# Patient Record
Sex: Male | Born: 1975 | Race: Black or African American | Hispanic: No | Marital: Single | Smoking: Current every day smoker
Health system: Southern US, Community
[De-identification: ages and names within clinical notes are randomized; demographics above are authoritative.]

## PROBLEM LIST (undated history)

## (undated) DIAGNOSIS — I10 Essential (primary) hypertension: Secondary | ICD-10-CM

## (undated) DIAGNOSIS — G4730 Sleep apnea, unspecified: Secondary | ICD-10-CM

## (undated) DIAGNOSIS — R0902 Hypoxemia: Secondary | ICD-10-CM

## (undated) DIAGNOSIS — K219 Gastro-esophageal reflux disease without esophagitis: Secondary | ICD-10-CM

## (undated) DIAGNOSIS — K760 Fatty (change of) liver, not elsewhere classified: Secondary | ICD-10-CM

## (undated) DIAGNOSIS — F4310 Post-traumatic stress disorder, unspecified: Secondary | ICD-10-CM

## (undated) DIAGNOSIS — E669 Obesity, unspecified: Secondary | ICD-10-CM

## (undated) DIAGNOSIS — F209 Schizophrenia, unspecified: Secondary | ICD-10-CM

## (undated) DIAGNOSIS — T7840XA Allergy, unspecified, initial encounter: Secondary | ICD-10-CM

## (undated) DIAGNOSIS — F410 Panic disorder [episodic paroxysmal anxiety] without agoraphobia: Secondary | ICD-10-CM

## (undated) DIAGNOSIS — F319 Bipolar disorder, unspecified: Secondary | ICD-10-CM

## (undated) DIAGNOSIS — F32A Depression, unspecified: Secondary | ICD-10-CM

## (undated) HISTORY — DX: Gastro-esophageal reflux disease without esophagitis: K21.9

## (undated) HISTORY — DX: Fatty (change of) liver, not elsewhere classified: K76.0

## (undated) HISTORY — DX: Allergy, unspecified, initial encounter: T78.40XA

## (undated) HISTORY — DX: Depression, unspecified: F32.A

## (undated) HISTORY — DX: Sleep apnea, unspecified: G47.30

## (undated) HISTORY — DX: Hypoxemia: R09.02

---

## 1999-09-24 ENCOUNTER — Emergency Department (HOSPITAL_COMMUNITY): Admission: EM | Admit: 1999-09-24 | Discharge: 1999-09-24 | Payer: Self-pay | Admitting: Emergency Medicine

## 2000-03-27 ENCOUNTER — Encounter: Payer: Self-pay | Admitting: Emergency Medicine

## 2000-03-27 ENCOUNTER — Emergency Department (HOSPITAL_COMMUNITY): Admission: EM | Admit: 2000-03-27 | Discharge: 2000-03-27 | Payer: Self-pay | Admitting: Emergency Medicine

## 2004-01-26 ENCOUNTER — Emergency Department (HOSPITAL_COMMUNITY): Admission: EM | Admit: 2004-01-26 | Discharge: 2004-01-26 | Payer: Self-pay | Admitting: Emergency Medicine

## 2004-03-05 ENCOUNTER — Emergency Department (HOSPITAL_COMMUNITY): Admission: EM | Admit: 2004-03-05 | Discharge: 2004-03-05 | Payer: Self-pay | Admitting: Emergency Medicine

## 2004-09-08 ENCOUNTER — Emergency Department (HOSPITAL_COMMUNITY): Admission: EM | Admit: 2004-09-08 | Discharge: 2004-09-08 | Payer: Self-pay | Admitting: Emergency Medicine

## 2005-06-23 ENCOUNTER — Ambulatory Visit: Payer: Self-pay | Admitting: Infectious Diseases

## 2005-06-23 ENCOUNTER — Inpatient Hospital Stay (HOSPITAL_COMMUNITY): Admission: EM | Admit: 2005-06-23 | Discharge: 2005-06-26 | Payer: Self-pay

## 2005-11-01 ENCOUNTER — Emergency Department (HOSPITAL_COMMUNITY): Admission: EM | Admit: 2005-11-01 | Discharge: 2005-11-01 | Payer: Self-pay | Admitting: Family Medicine

## 2005-11-02 ENCOUNTER — Ambulatory Visit (HOSPITAL_COMMUNITY): Admission: RE | Admit: 2005-11-02 | Discharge: 2005-11-02 | Payer: Self-pay | Admitting: Family Medicine

## 2005-11-10 ENCOUNTER — Ambulatory Visit: Payer: Self-pay | Admitting: Gastroenterology

## 2007-11-07 ENCOUNTER — Emergency Department (HOSPITAL_COMMUNITY): Admission: EM | Admit: 2007-11-07 | Discharge: 2007-11-07 | Payer: Self-pay | Admitting: Family Medicine

## 2007-11-09 ENCOUNTER — Emergency Department (HOSPITAL_COMMUNITY): Admission: EM | Admit: 2007-11-09 | Discharge: 2007-11-09 | Payer: Self-pay | Admitting: Emergency Medicine

## 2008-02-25 ENCOUNTER — Emergency Department (HOSPITAL_COMMUNITY): Admission: EM | Admit: 2008-02-25 | Discharge: 2008-02-25 | Payer: Self-pay | Admitting: Emergency Medicine

## 2008-02-27 ENCOUNTER — Emergency Department (HOSPITAL_COMMUNITY): Admission: EM | Admit: 2008-02-27 | Discharge: 2008-02-27 | Payer: Self-pay | Admitting: Emergency Medicine

## 2008-05-19 ENCOUNTER — Emergency Department (HOSPITAL_COMMUNITY): Admission: EM | Admit: 2008-05-19 | Discharge: 2008-05-19 | Payer: Self-pay | Admitting: Emergency Medicine

## 2009-01-17 ENCOUNTER — Encounter: Admission: RE | Admit: 2009-01-17 | Discharge: 2009-01-17 | Payer: Self-pay | Admitting: Internal Medicine

## 2011-01-09 NOTE — Discharge Summary (Signed)
Cristian Anderson, Cristian Anderson                 ACCOUNT NO.:  [id number]   MEDICAL RECORD NO.:  [id number]          PATIENT TYPE:  INP   LOCATION:  5730                         FACILITY:  [facility]   PHYSICIAN:  Fransisco Hertz, M.D.  DATE OF BIRTH:  Mar 27, 1976   DATE OF ADMISSION:  06/23/2005  DATE OF DISCHARGE:  06/26/2005                                 DISCHARGE SUMMARY   DISCHARGE DIAGNOSES:  1.  Ventilator dependent respiratory failure and respiratory distress      secondary to alcohol intoxication.  2.  Alcohol overdose/intoxication.  3.  Bipolar disorder.  4.  Schizophrenia.  5.  Marijuana abuse.  6.  Obesity.   DISCHARGE MEDICATIONS:  1.  Depakote 250 mg by mouth three times a day.  2.  Cogentin 1 mg by mouth two times a day.  3.  Prozac 100 mg by mouth daily.  4.  Seroquel 100 mg by mouth three times daily.  5.  Perphenazine 8 mg p.o. before bed.  6.  Clindamycin 300 mg by mouth four times a day as instructed by his      physician.  Note, the patient is not to take the methylprednisolone.   CONDITION ON DISCHARGE:  Good.   1.  The patient is to follow up with his scheduled appointment with Dr.      Ellamae Sia in psychiatry, on July 01, 2005 at 2:30 p.m.  2.  He will need a CBC and liver function test in one month.   PROCEDURES:  1.  The patient had several chest x-rays performed, on June 23, 2005,      which did not show any evidence of pneumonia but did show some low lung      volumes and mild left perihilar atelectasis.  2.  He had a CT of the head without contrast, on June 24, 2005, which was      negative.  3.  The chest x-ray was repeated, on June 24, 2005, to rule out      aspiration pneumonia and it showed improved atelectasis.  4.  An abdominal film was performed, on June 24, 2005, which showed no      evidence of obstruction of the bowel and some gas seen throughout the      rectum.  There were motion artifacts which limited the effectiveness of      the  exam.  5.  A chest x-ray was repeated, again on post extubation which the patient      did on his own, and this was on June 24, 2005.  There was no acute      disease.  6.  Intubation which was performed on June 23, 2005, oropharyngeal, for      respiratory distress secondary to altered mental status.  7.  Also, a nasogastric tube was placed shortly thereafter as the patient      was vomiting.   CONSULTATIONS:  Antonietta Breach, M.D. in psychiatry.   HISTORY AND PHYSICAL:  For a full H&P please consult the chart but in brief,  Mr. Schnabel is a 35 year old African  American man with a history of bipolar and  schizophrenia that was diagnosed recently who was brought into the ED by his  two brothers unresponsive.  Apparently, they had to assist him to his half  brother's mother's funeral and had been drinking heavily since.  According  to his brother, at about 9:30 on the night of admission he started to sweat  profusely and said that he was hurting and asked to be brought to the  hospital.  On his way to the hospital, he became more unresponsive and was  having trouble breathing and was subsequently intubated in the ED.  It was a  difficult intubation because the patient was very agitated and bit down on  the tube causing a broken tooth.  He was given 8 mg of Ativan at the time.  He was unable to answer questions at the time of examination.   ALLERGIES:  PENICILLIN.   PHYSICAL EXAMINATION:  VITAL SIGNS:  On admission his blood pressure was  128/72, pulse 111, other admission vitals are not recorded.  GENERAL:  He was intubated and unresponsive.  LUNGS:  He had diffuse rhonchi anteriorly.  HEART:  Regular rate and rhythm.  No murmurs, rubs, or gallops.  ABDOMEN:  Soft, nontender, nondistended with decreased bowel sounds.  EXTREMITIES:  Exhibited no cyanosis, clubbing, or edema.  He had good distal  pulses.  He had nonpalpable lymphadenopathy.   His UA was negative.  Urine drug screen  was positive for cannabinoids.  Alcohol level was 253 and ABG was pH 7.24, pCO2 56.4, pO2 332, and bicarb  24.3.  Lipase was 26.  PT 12.7.  INR 0.9.  PTT 29.  Hemoglobin 15.6,  hematocrit 44.7, white count 14.5, and platelets 272, MCV was 87.9.   HOSPITAL COURSE:  1.  For his ventilator-dependent respiratory distress, the patient was      transferred to the ICU for close monitoring and he remained on the      ventilator until he self extubated himself on June 24, 2005, early in      the morning around 8:30 a.m.  He was placed on Bi-PAP and had mild      respiratory acidosis secondary to hypoventilation.  His mental status      although not completely better was much improved and he was able to give      a good cough; therefore, he was not re-intubated since the reason for      his intubation was altered mental status.  He was placed on empiric      Avelox, vancomycin, and Cleocin for possible aspiration, per history and      some question about the chest x-ray.  The chest x-ray was rechecked and      there was really no evidence of aspiration.  He was followed closely      with ABGs and was found to continue to be slightly hypoventilating but      stable.  He was placed on nebs also at the time of admission for his      breathing.  On the second day of admission, the antibiotics were      discontinued because he remained afebrile and had no bump in his white      count to suggest that he had pneumonia.  The nebs were also discontinued      and he did improve on the Bi-PAP.  He finally discontinued the Bi-PAP      because he actually  started to hyperventilate a little bit and became a      little bit respiratory alkalotic, and he did well on nasal cannula.  He      was transferred out to the floor and did not experience any more      shortness of breath.  2.  Alcohol intoxication.  This is a patient who takes psychotropic meds at     baseline which can decrease the clearance time of  alcohol.  His blood      level was extraordinarily high at 253 which is felt to be responsible      for his respiratory distress because of altered mental status.  I      counseled both his family and the patient extensively on the need for      him to avoid alcohol.  He was still inebriated on the day following      admission and so he was observed closely.  He did not demonstrate any      signs of DT's including seizure activity or agitation.  He was held in      soft restraints, however, because he did self extubate, and he pulled      out his NG tube.  It is felt that his vomiting was because of the      alcohol and when he pulled out the NG tube it was not replaced.  He did      not vomit after that.  3.  Vomiting.  The patient was vomiting overnight after admission despite      the NG tube which he promptly pulled out when he pulled out the      ventilator tube.  He did not vomit since.  There was some concern that      he would vomit while in Bi-PAP and then re-aspirate; however, his mental      status was such that he was able to indicate that he did not feel like      he was going to vomit.  Clearly this was a soft call, we could have re-      intubated him and put in another NG tube or let him __________  but the      symptoms and physical exam all pointed to continuing him on Bi-PAP.  He      did not vomit any more during the hospitalization.  He did well on Bi-      PAP and ended up on nasal cannula.  4.  For his bipolar disorder and schizophrenia, we held all of his meds      until his mental status returned to normal and got a psych consult with      Dr. Jeanie Sewer.  Dr. Jeanie Sewer recommended that we begin Depacon IV 250      t.i.d. which would kill two birds with one stone, both control his      agitation and also to begin putting him back on his medications.  He has      not been with the diagnosis of bipolar and schizophrenia for a long time      and so there was some  concern about whether or not he is even taking his      medications and indeed his Depakote level less than 10.  Therefore, he      was started on the Depacon as recommended.  We held off on the rest of      his medications  until Dr. Jeanie Sewer saw him.  He was then restarted on      his Cogentin, his Prozac, and his Seroquel and Perphenazine, after Dr.      Jeanie Sewer saw him and then he was promptly discharged.  Please note that      his family was incredibly supportive and attentive throughout his      hospitalization.  There was at least one and up to two or three people      in the room at all times.  They were helpful and supportive and are the      type of family that he needs at this time.  I spoke with them      extensively about the need for him to avoid alcohol and to take his      medicines and they expressed understanding as did the patient.   PHYSICAL EXAMINATION:  GENERAL:  On discharge, he had no complaints, able to ambulate without difficulty.  VITAL SIGNS:  Temperature 96.6, blood pressure 151/90, pulse 87,  respirations 18.  He was sating 98% on room air.  NEUROLOGIC:  Alert and  oriented  x4.  No acute distress.  Pupils equal, round, reactive to light  and accommodation.  Extraocular movements intact.  HEART:  Regular rate and rhythm.  No murmurs, rubs, or gallops.  LUNGS:  Clear to auscultation bilaterally.  ABDOMEN:  Soft, nontender, nondistended.  EXTREMITIES:  Without edema.  NEURO:  Focally intact.  No asterixis or tremors.   His hemoglobin was 13.8, hematocrit 39.4, white count 8.8, platelets 231.  Sodium 144, potassium 3.5, chloride 110, bicarb 29, BUN 3, creatinine 1.1  and glucose 96.      Clearance Coots, M.D.    ______________________________  Fransisco Hertz, M.D.    IN/MEDQ  D:  06/27/2005  T:  06/27/2005  Job:  [id number]   cc:   Ellamae Sia, Dr.

## 2011-01-12 ENCOUNTER — Emergency Department (HOSPITAL_COMMUNITY): Payer: Medicaid Other

## 2011-01-12 ENCOUNTER — Emergency Department (HOSPITAL_COMMUNITY)
Admission: EM | Admit: 2011-01-12 | Discharge: 2011-01-13 | Disposition: A | Discharge status: Home / Self Care | Payer: Medicaid Other | Attending: Emergency Medicine | Admitting: Emergency Medicine

## 2011-01-12 ENCOUNTER — Inpatient Hospital Stay (INDEPENDENT_AMBULATORY_CARE_PROVIDER_SITE_OTHER)
Admission: RE | Admit: 2011-01-12 | Discharge: 2011-01-12 | Disposition: A | Discharge status: Home / Self Care | Payer: Medicaid Other | Source: Ambulatory Visit | Attending: Emergency Medicine | Admitting: Emergency Medicine

## 2011-01-12 DIAGNOSIS — R109 Unspecified abdominal pain: Secondary | ICD-10-CM | POA: Insufficient documentation

## 2011-01-12 DIAGNOSIS — R197 Diarrhea, unspecified: Secondary | ICD-10-CM | POA: Insufficient documentation

## 2011-01-12 DIAGNOSIS — F319 Bipolar disorder, unspecified: Secondary | ICD-10-CM | POA: Insufficient documentation

## 2011-01-12 DIAGNOSIS — R0609 Other forms of dyspnea: Secondary | ICD-10-CM

## 2011-01-12 DIAGNOSIS — E669 Obesity, unspecified: Secondary | ICD-10-CM | POA: Insufficient documentation

## 2011-01-12 DIAGNOSIS — G4730 Sleep apnea, unspecified: Secondary | ICD-10-CM | POA: Insufficient documentation

## 2011-01-12 DIAGNOSIS — K5289 Other specified noninfective gastroenteritis and colitis: Secondary | ICD-10-CM | POA: Insufficient documentation

## 2011-01-12 DIAGNOSIS — I498 Other specified cardiac arrhythmias: Secondary | ICD-10-CM | POA: Insufficient documentation

## 2011-01-12 DIAGNOSIS — R112 Nausea with vomiting, unspecified: Secondary | ICD-10-CM | POA: Insufficient documentation

## 2011-01-12 DIAGNOSIS — R0989 Other specified symptoms and signs involving the circulatory and respiratory systems: Secondary | ICD-10-CM

## 2011-01-12 DIAGNOSIS — R0602 Shortness of breath: Secondary | ICD-10-CM | POA: Insufficient documentation

## 2011-01-12 DIAGNOSIS — I10 Essential (primary) hypertension: Secondary | ICD-10-CM | POA: Insufficient documentation

## 2011-01-12 HISTORY — DX: Essential (primary) hypertension: I10

## 2011-01-12 LAB — CBC
HCT: 43.1 % (ref 39.0–52.0)
Hemoglobin: 15 g/dL (ref 13.0–17.0)
MCH: 29.9 pg (ref 26.0–34.0)
MCHC: 34.8 g/dL (ref 30.0–36.0)
MCV: 85.9 fL (ref 78.0–100.0)
Platelets: 202 10*3/uL (ref 150–400)
RBC: 5.02 MIL/uL (ref 4.22–5.81)
RDW: 13 % (ref 11.5–15.5)
WBC: 7.1 10*3/uL (ref 4.0–10.5)

## 2011-01-12 LAB — URINALYSIS, ROUTINE W REFLEX MICROSCOPIC
Bilirubin Urine: NEGATIVE
Glucose, UA: NEGATIVE mg/dL
Ketones, ur: NEGATIVE mg/dL
Leukocytes, UA: NEGATIVE
Nitrite: NEGATIVE
Specific Gravity, Urine: 1.017 (ref 1.005–1.030)
Urobilinogen, UA: 0.2 mg/dL (ref 0.0–1.0)
pH: 5.5 (ref 5.0–8.0)

## 2011-01-12 LAB — COMPREHENSIVE METABOLIC PANEL
AST: 51 U/L — ABNORMAL HIGH (ref 0–37)
Alkaline Phosphatase: 72 U/L (ref 39–117)
CO2: 25 mEq/L (ref 19–32)
Calcium: 9.1 mg/dL (ref 8.4–10.5)
Chloride: 102 mEq/L (ref 96–112)
Creatinine, Ser: 0.91 mg/dL (ref 0.4–1.5)
GFR calc Af Amer: >60 mL/min (ref >60)
GFR calc non Af Amer: >60 mL/min (ref >60)
Potassium: 3.5 mEq/L (ref 3.5–5.1)
Sodium: 138 mEq/L (ref 135–145)
Total Protein: 7 g/dL (ref 6.0–8.3)

## 2011-01-12 LAB — DIFFERENTIAL
Basophils Absolute: 0 10*3/uL (ref 0.0–0.1)
Basophils Relative: 0 % (ref 0–1)
Eosinophils Absolute: 0.1 10*3/uL (ref 0.0–0.7)
Lymphocytes Relative: 21 % (ref 12–46)
Lymphs Abs: 1.5 10*3/uL (ref 0.7–4.0)
Monocytes Relative: 8 % (ref 3–12)
Neutro Abs: 4.9 10*3/uL (ref 1.7–7.7)
Neutrophils Relative %: 69 % (ref 43–77)

## 2011-01-12 LAB — POCT I-STAT, CHEM 8
BUN: 7 mg/dL (ref 6–23)
Calcium, Ion: 1.08 mmol/L — ABNORMAL LOW (ref 1.12–1.32)
Chloride: 101 mEq/L (ref 96–112)
Creatinine, Ser: 1.4 mg/dL (ref 0.4–1.5)
Glucose, Bld: 121 mg/dL — ABNORMAL HIGH (ref 70–99)
HCT: 47 % (ref 39.0–52.0)
Hemoglobin: 16 g/dL (ref 13.0–17.0)
Potassium: 3.4 mEq/L — ABNORMAL LOW (ref 3.5–5.1)

## 2011-01-12 LAB — LIPASE, BLOOD: Lipase: 19 U/L (ref 11–59)

## 2011-01-12 LAB — URINE MICROSCOPIC-ADD ON

## 2011-01-13 ENCOUNTER — Encounter (HOSPITAL_COMMUNITY): Payer: Self-pay | Admitting: Radiology

## 2011-01-13 MED ORDER — IOHEXOL 350 MG/ML SOLN
80.0000 mL | Freq: Once | INTRAVENOUS | Status: AC | PRN
  Administered 2011-01-13: 80 mL via INTRAVENOUS

## 2011-05-18 LAB — INFLUENZA A AND B ANTIGEN (CONVERTED LAB)
Inflenza A Ag: NEGATIVE
Influenza B Ag: NEGATIVE

## 2011-05-18 LAB — RAPID STREP SCREEN (MED CTR MEBANE ONLY): Streptococcus, Group A Screen (Direct): NEGATIVE

## 2011-05-18 LAB — POCT RAPID STREP A: Streptococcus, Group A Screen (Direct): NEGATIVE

## 2011-05-21 LAB — LIPASE, BLOOD: Lipase: 44

## 2011-05-21 LAB — CBC
MCHC: 34.7
MCV: 87.9
Platelets: 210
RDW: 14.2

## 2011-05-21 LAB — COMPREHENSIVE METABOLIC PANEL
ALT: 58 — ABNORMAL HIGH
AST: 40 — ABNORMAL HIGH
Albumin: 3.8
CO2: 31
Calcium: 9.4
Creatinine, Ser: 1.18
GFR calc Af Amer: >60
GFR calc non Af Amer: >60
Sodium: 140
Total Protein: 6.8

## 2011-05-21 LAB — URINALYSIS, ROUTINE W REFLEX MICROSCOPIC
Bilirubin Urine: NEGATIVE
Glucose, UA: NEGATIVE
Hgb urine dipstick: NEGATIVE
Ketones, ur: NEGATIVE
Nitrite: NEGATIVE
Protein, ur: NEGATIVE
Specific Gravity, Urine: 1.014
Urobilinogen, UA: 1
pH: 7.5

## 2011-05-21 LAB — RAPID URINE DRUG SCREEN, HOSP PERFORMED
Amphetamines: NOT DETECTED
Barbiturates: NOT DETECTED
Benzodiazepines: NOT DETECTED
Cocaine: NOT DETECTED
Opiates: NOT DETECTED
Tetrahydrocannabinol: NOT DETECTED

## 2011-05-21 LAB — DIFFERENTIAL
Eosinophils Relative: 2
Lymphocytes Relative: 37
Lymphs Abs: 3
Monocytes Relative: 11

## 2011-05-21 LAB — URINE MICROSCOPIC-ADD ON

## 2011-07-20 ENCOUNTER — Emergency Department (HOSPITAL_COMMUNITY)
Admission: EM | Admit: 2011-07-20 | Discharge: 2011-07-21 | Discharge status: Against Medical Advice | Payer: Medicaid Other | Attending: Emergency Medicine | Admitting: Emergency Medicine

## 2011-07-20 DIAGNOSIS — Z0389 Encounter for observation for other suspected diseases and conditions ruled out: Secondary | ICD-10-CM | POA: Insufficient documentation

## 2011-07-20 HISTORY — DX: Panic disorder (episodic paroxysmal anxiety): F41.0

## 2011-07-20 HISTORY — DX: Schizophrenia, unspecified: F20.9

## 2011-07-20 HISTORY — DX: Obesity, unspecified: E66.9

## 2011-07-21 ENCOUNTER — Encounter (HOSPITAL_COMMUNITY): Payer: Self-pay | Admitting: Emergency Medicine

## 2011-07-21 NOTE — ED Notes (Signed)
PT. REPORTS PANIC ATTACK AND ANXIETY ATTACK THIS EVENING , ALSO REPORTS NASAL CONGESTION .

## 2011-12-30 ENCOUNTER — Encounter (HOSPITAL_COMMUNITY): Payer: Self-pay

## 2011-12-30 ENCOUNTER — Emergency Department (HOSPITAL_COMMUNITY)
Admission: EM | Admit: 2011-12-30 | Discharge: 2011-12-30 | Disposition: A | Discharge status: Home / Self Care | Payer: Medicaid Other | Attending: Emergency Medicine | Admitting: Emergency Medicine

## 2011-12-30 DIAGNOSIS — I10 Essential (primary) hypertension: Secondary | ICD-10-CM | POA: Insufficient documentation

## 2011-12-30 DIAGNOSIS — F209 Schizophrenia, unspecified: Secondary | ICD-10-CM | POA: Insufficient documentation

## 2011-12-30 DIAGNOSIS — F17200 Nicotine dependence, unspecified, uncomplicated: Secondary | ICD-10-CM | POA: Insufficient documentation

## 2011-12-30 DIAGNOSIS — R509 Fever, unspecified: Secondary | ICD-10-CM

## 2011-12-30 LAB — COMPREHENSIVE METABOLIC PANEL
BUN: 8 mg/dL (ref 6–23)
CO2: 19 mEq/L (ref 19–32)
Calcium: 9.2 mg/dL (ref 8.4–10.5)
Creatinine, Ser: 1.13 mg/dL (ref 0.50–1.35)
GFR calc Af Amer: >90 mL/min (ref >90)
GFR calc non Af Amer: 82 mL/min — ABNORMAL LOW (ref >90)
Glucose, Bld: 103 mg/dL — ABNORMAL HIGH (ref 70–99)

## 2011-12-30 LAB — CBC
HCT: 44 % (ref 39.0–52.0)
MCH: 30.2 pg (ref 26.0–34.0)
MCV: 85.8 fL (ref 78.0–100.0)
RBC: 5.13 MIL/uL (ref 4.22–5.81)
RDW: 13.1 % (ref 11.5–15.5)
WBC: 14.4 10*3/uL — ABNORMAL HIGH (ref 4.0–10.5)

## 2011-12-30 LAB — URINALYSIS, ROUTINE W REFLEX MICROSCOPIC
Glucose, UA: NEGATIVE mg/dL
Leukocytes, UA: NEGATIVE
Nitrite: NEGATIVE
Protein, ur: 100 mg/dL — AB

## 2011-12-30 LAB — LACTIC ACID, PLASMA: Lactic Acid, Venous: 1.6 mmol/L (ref 0.5–2.2)

## 2011-12-30 LAB — URINE MICROSCOPIC-ADD ON

## 2011-12-30 LAB — DIFFERENTIAL
Eosinophils Relative: 1 % (ref 0–5)
Lymphocytes Relative: 14 % (ref 12–46)
Lymphs Abs: 2.1 10*3/uL (ref 0.7–4.0)
Monocytes Absolute: 1.6 10*3/uL — ABNORMAL HIGH (ref 0.1–1.0)

## 2011-12-30 LAB — PROCALCITONIN: Procalcitonin: 0.79 ng/mL

## 2011-12-30 MED ORDER — IBUPROFEN 100 MG/5ML PO SUSP
800.0000 mg | Freq: Four times a day (QID) | ORAL | Status: DC
  Administered 2011-12-30: 800 mg via ORAL
  Filled 2011-12-30 (×4): qty 40

## 2011-12-30 MED ORDER — SODIUM CHLORIDE 0.9 % IV SOLN
1000.0000 mL | INTRAVENOUS | Status: DC
  Administered 2011-12-30: 1000 mL via INTRAVENOUS

## 2011-12-30 NOTE — ED Notes (Signed)
PT.was given a urinal 

## 2011-12-30 NOTE — Discharge Instructions (Signed)
Take ibuprofen, 800 mg, every 8 hours until the fever stops. Return here, or see your doctor; tomorrow for reevaluation. Started on a clear liquid diet, for 6 hours, then gradually advance your diet to bland foods, until feeling better  Fever, Adult A fever is a higher than normal body temperature. In an adult, an oral temperature around 98.6 F (37 C) is considered normal. A temperature of 100.4 F (38 C) or higher is generally considered a fever. Mild or moderate fevers generally have no long-term effects and often do not require treatment. Extreme fever (greater than or equal to 106 F or 41.1 C) can cause seizures. The sweating that may occur with repeated or prolonged fever may cause dehydration. Elderly people can develop confusion during a fever. A measured temperature can vary with:  Age.   Time of day.   Method of measurement (mouth, underarm, rectal, or ear).  The fever is confirmed by taking a temperature with a thermometer. Temperatures can be taken different ways. Some methods are accurate and some are not.  An oral temperature is used most commonly. Electronic thermometers are fast and accurate.   An ear temperature will only be accurate if the thermometer is positioned as recommended by the manufacturer.   A rectal temperature is accurate and done for those adults who have a condition where an oral temperature cannot be taken.   An underarm (axillary) temperature is not accurate and not recommended.  Fever is a symptom, not a disease.  CAUSES   Infections commonly cause fever.   Some noninfectious causes for fever include:   Some arthritis conditions.   Some thyroid or adrenal gland conditions.   Some immune system conditions.   Some types of cancer.   A medicine reaction.   High doses of certain street drugs such as methamphetamine.   Dehydration.   Exposure to high outside or room temperatures.   Occasionally, the source of a fever cannot be  determined. This is sometimes called a "fever of unknown origin" (FUO).   Some situations may lead to a temporary rise in body temperature that may go away on its own. Examples are:   Childbirth.   Surgery.   Intense exercise.  HOME CARE INSTRUCTIONS   Take appropriate medicines for fever. Follow dosing instructions carefully. If you use acetaminophen to reduce the fever, be careful to avoid taking other medicines that also contain acetaminophen. Do not take aspirin for a fever if you are younger than age 93. There is an association with Reye's syndrome. Reye's syndrome is a rare but potentially deadly disease.   If an infection is present and antibiotics have been prescribed, take them as directed. Finish them even if you start to feel better.   Rest as needed.   Maintain an adequate fluid intake. To prevent dehydration during an illness with prolonged or recurrent fever, you may need to drink extra fluid.Drink enough fluids to keep your urine clear or pale yellow.   Sponging or bathing with room temperature water may help reduce body temperature. Do not use ice water or alcohol sponge baths.   Dress comfortably, but do not over-bundle.  SEEK MEDICAL CARE IF:   You are unable to keep fluids down.   You develop vomiting or diarrhea.   You are not feeling at least partly better after 3 days.   You develop new symptoms or problems.  SEEK IMMEDIATE MEDICAL CARE IF:   You have shortness of breath or trouble breathing.   You  develop excessive weakness.   You are dizzy or you faint.   You are extremely thirsty or you are making little or no urine.   You develop new pain that was not there before (such as in the head, neck, chest, back, or abdomen).   You have persistant vomiting and diarrhea for more than 1 to 2 days.   You develop a stiff neck or your eyes become sensitive to light.   You develop a skin rash.   You have a fever or persistent symptoms for more than 2 to 3  days.   You have a fever and your symptoms suddenly get worse.  MAKE SURE YOU:   Understand these instructions.   Will watch your condition.   Will get help right away if you are not doing well or get worse.  Document Released: 02/03/2001 Document Revised: 07/30/2011 Document Reviewed: 06/11/2011 [facility] Patient Information 2012 [place], [place].Fever of Unknown Origin Fever of "unknown origin" is a fever of at least 101 F (38.3 C) or greater, and that has gone on daily for three weeks. It is a fever which has a hidden cause. Fever is a higher-than-normal body temperature. Normal temperature is usually defined as 98.6 F or 37 C. Fever is a symptom, not a disease. A fever may mean that there is something else going on in the body that is causing it. CAUSES Fever can be caused by many conditions, including:   Infections.   Tissue injuries.   Medicines.   Different diseases.   Being in hot surroundings.   Tumors or cancers (this is a rare cause).  SYMPTOMS The signs and symptoms of a fever depend on the cause. At first, a fever can cause a chill. When the brain raises the body's "thermostat," the body responds by shivering to raise the temperature. Shivering produces heat in the body. Once the temperature goes up, the person often feels warm. When the fever goes away, the person may start to sweat. DIAGNOSIS  There can be many causes of fever. Sometimes, the reason can be very difficult to find. Your caregiver may have to do numerous tests to track down the reason. TREATMENT   Medication may be used to control fever.   Do not use aspirin because of the association with Reye's syndrome.   If an infection is suspected to be causing the fever and medications have been prescribed, take them as directed. Finish the full course of medications until they are gone.   Sponging or bathing in lukewarm water can cool the skin and reduce body temperature. Ice water or alcohol sponge baths  are not as effective as lukewarm water and should not be used.  HOME CARE  Continue to eat normally.   Drink enough fluids to keep urine clear or pale yellow.   Broths, decaffeinated tea, decaffeinated soft drinks, and oral rehydration solutions (ORS) can help replace fluids and electrolytes.   Keep all follow-up appointments as directed by your caregiver.   Weigh yourself once a day. Write down the weights and bring them to your follow-up appointments to review with your caregiver.  SEEK IMMEDIATE MEDICAL CARE IF:   You or your child is unable to keep fluids down.   Vomiting or diarrhea develop or are present and become persistent (continued).   There is excessive weakness, dizziness, fainting or extreme thirst.   You have a fever or persistent symptoms for more than 72 hours.   You have a fever and your symptoms suddenly get  worse.  Document Released: 06/26/2004 Document Revised: 07/30/2011 Document Reviewed: 08/10/2005 [facility] Patient Information 2012 [place], [place].

## 2011-12-30 NOTE — ED Notes (Signed)
Patient here with headache, backpain and fever x 1 day. The highest temp 104 yesterday, took ibuprofen adult liquid PTA-unsure dosage.  No cold symptoms with same

## 2011-12-30 NOTE — ED Notes (Signed)
Pt placed on cardiac monitor 

## 2011-12-30 NOTE — ED Provider Notes (Signed)
History     CSN: [id number]  Arrival date & time 12/30/11  1117   First MD Initiated Contact with Patient 12/30/11 1320      Chief Complaint  Patient presents with  . Fever    (Consider location/radiation/quality/duration/timing/severity/associated sxs/prior treatment) Patient is a 36 y.o. male presenting with fever. The history is provided by the patient.  Fever Primary symptoms of the febrile illness include fever. Primary symptoms do not include fatigue, visual change, headaches, cough, wheezing, shortness of breath, abdominal pain, nausea, vomiting, diarrhea or dysuria. The current episode started today. This is a new problem. The problem has not changed since onset. No neck or back pain. No photopobia. He took Ibuprofen, 400 mg at 5:30 AM.   Past Medical History  Diagnosis Date  . Hypertension   . Schizophrenia   . Anxiety attack   . Panic attack   . Obesity     History reviewed. No pertinent past surgical history.  No family history on file.  History  Substance Use Topics  . Smoking status: Current Everyday Smoker  . Smokeless tobacco: Not on file  . Alcohol Use: No      Review of Systems  Constitutional: Positive for fever. Negative for fatigue.  Respiratory: Negative for cough, shortness of breath and wheezing.   Gastrointestinal: Negative for nausea, vomiting, abdominal pain and diarrhea.  Genitourinary: Negative for dysuria.  Neurological: Negative for headaches.  All other systems reviewed and are negative.    Allergies  Penicillins and Tylenol  Home Medications   Current Outpatient Rx  Name Route Sig Dispense Refill  . FLUOXETINE HCL 40 MG PO CAPS Oral Take 40 mg by mouth daily.      Marland Kitchen HYDROCHLOROTHIAZIDE 25 MG PO TABS Oral Take 25 mg by mouth every morning.      . IBUPROFEN 100 MG/5ML PO SUSP Oral Take 600 mg by mouth every 6 (six) hours as needed. For fever.    Marland Kitchen LISINOPRIL 20 MG PO TABS Oral Take 20 mg by mouth daily.      Marland Kitchen PERPHENAZINE 8  MG PO TABS Oral Take 16 mg by mouth 2 (two) times daily.      Marland Kitchen POTASSIUM CHLORIDE CRYS ER 10 MEQ PO TBCR Oral Take 10 mEq by mouth daily.      Marland Kitchen PRAVASTATIN SODIUM 10 MG PO TABS Oral Take 10 mg by mouth at bedtime.      Marland Kitchen QUETIAPINE FUMARATE 200 MG PO TABS Oral Take 400 mg by mouth 2 (two) times daily.        BP 136/72  Pulse 117  Temp(Src) 99.6 F (37.6 C) (Oral)  Resp 18  SpO2 99%  Physical Exam  Nursing note and vitals reviewed. Constitutional: He is oriented to person, place, and time. He appears well-developed and well-nourished.  HENT:  Head: Normocephalic and atraumatic.  Right Ear: External ear normal.  Left Ear: External ear normal.  Eyes: Conjunctivae and EOM are normal. Pupils are equal, round, and reactive to light.  Neck: Normal range of motion and phonation normal. Neck supple. No tracheal deviation present. No thyromegaly present.       No meningismus  Cardiovascular: Normal rate, regular rhythm, normal heart sounds and intact distal pulses.   Pulmonary/Chest: Effort normal and breath sounds normal. He exhibits no bony tenderness.  Abdominal: Soft. Normal appearance. There is no tenderness.  Musculoskeletal: Normal range of motion.  Lymphadenopathy:    He has no cervical adenopathy.  Neurological: He is alert and oriented  to person, place, and time. He has normal strength. No cranial nerve deficit or sensory deficit. He exhibits normal muscle tone. Coordination normal.  Skin: Skin is warm, dry and intact.  Psychiatric: He has a normal mood and affect. His behavior is normal. Judgment and thought content normal.    ED Course  Procedures (including critical care time)  ED Treatment: IVF bolus an drip, oral fluids, Ibuprofen- Pt felt better and had no further c/o at d/c.  Labs Reviewed  CBC - Abnormal; Notable for the following:    WBC 14.4 (*)    All other components within normal limits  DIFFERENTIAL - Abnormal; Notable for the following:    Neutro Abs 10.5  (*)    Monocytes Absolute 1.6 (*)    All other components within normal limits  COMPREHENSIVE METABOLIC PANEL - Abnormal; Notable for the following:    Sodium 132 (*)    Glucose, Bld 103 (*)    GFR calc non Af Amer 82 (*)    All other components within normal limits  URINALYSIS, ROUTINE W REFLEX MICROSCOPIC - Abnormal; Notable for the following:    Color, Urine ORANGE (*) BIOCHEMICALS MAY BE AFFECTED BY COLOR   APPearance CLOUDY (*)    Specific Gravity, Urine 1.036 (*)    Hgb urine dipstick MODERATE (*)    Bilirubin Urine SMALL (*)    Ketones, ur TRACE (*)    Protein, ur 100 (*)    All other components within normal limits  URINE MICROSCOPIC-ADD ON - Abnormal; Notable for the following:    Squamous Epithelial / LPF FEW (*)    Bacteria, UA FEW (*)    All other components within normal limits  LACTIC ACID, PLASMA  PROCALCITONIN  CULTURE, BLOOD (ROUTINE X 2)  CULTURE, BLOOD (ROUTINE X 2)  URINE CULTURE   No results found.   1. Febrile illness       MDM  Nonspecific illness with improvement in ED. Suspect viral process. No localizing sx. Doubt sepsis, SBI, metabolic instability.   Plan: Home Medications- Ibuprofen q 8 hr.; Home Treatments- Rest and oral fluids; Recommended follow up- f/u PCP or here if not better in 1-2 days.     Flint Melter, MD 12/30/11 2121

## 2011-12-30 NOTE — ED Notes (Signed)
Pt reports having fever starting yesterday morning. Took unknown amount of ibuprofen at 05:00 this morning. Reports headache 4/10. Denies vision problems. Denies photophobia, phonophobia. Denies N/V/D or cough at this time. Pt is A/O x4. Skin warm and dry. Respirations even and unlabored. NAD noted at this time.

## 2012-01-01 LAB — URINE CULTURE: Colony Count: 50000

## 2012-01-05 LAB — CULTURE, BLOOD (ROUTINE X 2)

## 2012-01-06 LAB — CULTURE, BLOOD (ROUTINE X 2)
Culture  Setup Time: 201305090005
Culture: NO GROWTH

## 2012-10-14 ENCOUNTER — Other Ambulatory Visit (HOSPITAL_COMMUNITY): Payer: Self-pay | Admitting: Internal Medicine

## 2012-10-17 ENCOUNTER — Ambulatory Visit (HOSPITAL_COMMUNITY)
Admission: RE | Admit: 2012-10-17 | Discharge: 2012-10-17 | Disposition: A | Discharge status: Home / Self Care | Payer: Medicaid Other | Source: Ambulatory Visit | Attending: Internal Medicine | Admitting: Internal Medicine

## 2012-10-17 DIAGNOSIS — R319 Hematuria, unspecified: Secondary | ICD-10-CM | POA: Insufficient documentation

## 2012-11-27 ENCOUNTER — Emergency Department (HOSPITAL_COMMUNITY)
Admission: EM | Admit: 2012-11-27 | Discharge: 2012-11-27 | Disposition: A | Discharge status: Home / Self Care | Payer: Medicaid Other | Attending: Emergency Medicine | Admitting: Emergency Medicine

## 2012-11-27 ENCOUNTER — Encounter (HOSPITAL_COMMUNITY): Payer: Self-pay | Admitting: Emergency Medicine

## 2012-11-27 DIAGNOSIS — F2089 Other schizophrenia: Secondary | ICD-10-CM | POA: Insufficient documentation

## 2012-11-27 DIAGNOSIS — E669 Obesity, unspecified: Secondary | ICD-10-CM | POA: Insufficient documentation

## 2012-11-27 DIAGNOSIS — K602 Anal fissure, unspecified: Secondary | ICD-10-CM

## 2012-11-27 DIAGNOSIS — F410 Panic disorder [episodic paroxysmal anxiety] without agoraphobia: Secondary | ICD-10-CM | POA: Insufficient documentation

## 2012-11-27 DIAGNOSIS — F17200 Nicotine dependence, unspecified, uncomplicated: Secondary | ICD-10-CM | POA: Insufficient documentation

## 2012-11-27 DIAGNOSIS — I10 Essential (primary) hypertension: Secondary | ICD-10-CM | POA: Insufficient documentation

## 2012-11-27 DIAGNOSIS — Z79899 Other long term (current) drug therapy: Secondary | ICD-10-CM | POA: Insufficient documentation

## 2012-11-27 MED ORDER — DOCUSATE SODIUM 100 MG PO CAPS: 100.0000 mg | ORAL_CAPSULE | Freq: Two times a day (BID) | ORAL | Status: DC

## 2012-11-27 NOTE — ED Notes (Signed)
Pt alert, arrives from home, c/o rectal pain, onset was this evening, denies changes in bowel or bladder, denies noting blood in/on stool, resp even unlabored, skin pwd

## 2012-11-27 NOTE — ED Provider Notes (Signed)
History     CSN: [id number]  Arrival date & time 11/27/12  0531   First MD Initiated Contact with Patient 11/27/12 [phone]      Chief Complaint  Patient presents with  . Rectal Pain    (Consider location/radiation/quality/duration/timing/severity/associated sxs/prior treatment) HPI Complains of rectal pain onset 5 or 6 hours ago meter after having a bowel movement pain is at anus, nonradiating not made better or worse by anything. Treated himself with hydrocodone 3 hours ago, without relief. No other associated symptoms no abdominal pain no fever. No nausea vomiting. No blood per rectum Past Medical History  Diagnosis Date  . Hypertension   . Schizophrenia   . Anxiety attack   . Panic attack   . Obesity     History reviewed. No pertinent past surgical history.  No family history on file.  History  Substance Use Topics  . Smoking status: Current Every Day Smoker  . Smokeless tobacco: Not on file  . Alcohol Use: No      Review of Systems  Constitutional: Negative.   Gastrointestinal: Positive for rectal pain.  Psychiatric/Behavioral: Negative.     Allergies  Penicillins and Tylenol  Home Medications   Current Outpatient Rx  Name  Route  Sig  Dispense  Refill  . cyclobenzaprine (FLEXERIL) 5 MG tablet   Oral   Take 5 mg by mouth 3 (three) times daily as needed for muscle spasms.         Marland Kitchen FLUoxetine (PROZAC) 40 MG capsule   Oral   Take 40 mg by mouth every morning.          . hydrochlorothiazide (HYDRODIURIL) 25 MG tablet   Oral   Take 25 mg by mouth every morning.           Marland Kitchen HYDROcodone-acetaminophen (NORCO/VICODIN) 5-325 MG per tablet   Oral   Take 1 tablet by mouth every 6 (six) hours as needed for pain.         Marland Kitchen lisinopril (PRINIVIL,ZESTRIL) 20 MG tablet   Oral   Take 20 mg by mouth daily.           Marland Kitchen perphenazine (TRILAFON) 8 MG tablet   Oral   Take 16 mg by mouth 2 (two) times daily.           . potassium chloride  (K-DUR,KLOR-CON) 10 MEQ tablet   Oral   Take 10 mEq by mouth every morning.          . pravastatin (PRAVACHOL) 10 MG tablet   Oral   Take 10 mg by mouth at bedtime.           Marland Kitchen QUEtiapine (SEROQUEL) 200 MG tablet   Oral   Take 400 mg by mouth 2 (two) times daily.             BP 156/100  Pulse 98  Temp(Src) 98 F (36.7 C)  Resp 16  Ht 5\' 8"  (1.727 m)  Wt 287 lb (130.182 kg)  BMI 43.65 kg/m2  SpO2 98%  Physical Exam  Nursing note and vitals reviewed. Constitutional: He appears well-developed and well-nourished. No distress.  HENT:  Head: Normocephalic and atraumatic.  Eyes: Conjunctivae are normal. Pupils are equal, round, and reactive to light.  Neck: Neck supple. No tracheal deviation present. No thyromegaly present.  Cardiovascular: Normal rate and regular rhythm.   No murmur heard. Pulmonary/Chest: Effort normal and breath sounds normal.  Abdominal: Soft. Bowel sounds are normal. He exhibits no distension. There is  no tenderness.  Morbidly obese  Genitourinary: Prostate normal and penis normal. Guaiac positive stool.  Anal fissure at 12:00. With tenderness. No masses. Brown stool no gross blood. Hemoccult-positive  Musculoskeletal: Normal range of motion. He exhibits no edema and no tenderness.  Neurological: He is alert. Coordination normal.  Skin: Skin is warm and dry. No rash noted.  Psychiatric: He has a normal mood and affect.    ED Course  Procedures (including critical care time)  Labs Reviewed - No data to display No results found.   No diagnosis found.    MDM  Plan sitz baths prescription Colace blood pressure recheck Diagnosis #1 anal fissure #2htn        Doug Sou, MD 11/27/12 [phone]

## 2012-11-27 NOTE — ED Notes (Signed)
Pt given d/c information. Both patient and wife verbalized understanding of rx and f/u appointment and the severity of monitoring and adhering to medication regimen in relation to bp. Teach back used and successful with 3pt return. Pt escorted out and was ambulatrory.

## 2013-01-15 ENCOUNTER — Emergency Department (HOSPITAL_COMMUNITY)
Admission: EM | Admit: 2013-01-15 | Discharge: 2013-01-15 | Disposition: A | Discharge status: Home / Self Care | Payer: Medicaid Other | Attending: Emergency Medicine | Admitting: Emergency Medicine

## 2013-01-15 ENCOUNTER — Encounter (HOSPITAL_COMMUNITY): Payer: Self-pay | Admitting: *Deleted

## 2013-01-15 DIAGNOSIS — Z79899 Other long term (current) drug therapy: Secondary | ICD-10-CM | POA: Insufficient documentation

## 2013-01-15 DIAGNOSIS — F17200 Nicotine dependence, unspecified, uncomplicated: Secondary | ICD-10-CM | POA: Insufficient documentation

## 2013-01-15 DIAGNOSIS — E669 Obesity, unspecified: Secondary | ICD-10-CM | POA: Insufficient documentation

## 2013-01-15 DIAGNOSIS — F410 Panic disorder [episodic paroxysmal anxiety] without agoraphobia: Secondary | ICD-10-CM | POA: Insufficient documentation

## 2013-01-15 DIAGNOSIS — F209 Schizophrenia, unspecified: Secondary | ICD-10-CM | POA: Insufficient documentation

## 2013-01-15 DIAGNOSIS — Z880 Allergy status to penicillin: Secondary | ICD-10-CM | POA: Insufficient documentation

## 2013-01-15 DIAGNOSIS — I10 Essential (primary) hypertension: Secondary | ICD-10-CM | POA: Insufficient documentation

## 2013-01-15 DIAGNOSIS — Z8719 Personal history of other diseases of the digestive system: Secondary | ICD-10-CM | POA: Insufficient documentation

## 2013-01-15 DIAGNOSIS — K602 Anal fissure, unspecified: Secondary | ICD-10-CM | POA: Insufficient documentation

## 2013-01-15 MED ORDER — SENNOSIDES-DOCUSATE SODIUM 8.6-50 MG PO TABS: ORAL_TABLET | ORAL | Status: DC

## 2013-01-15 MED ORDER — HYDROMORPHONE HCL PF 2 MG/ML IJ SOLN
2.0000 mg | Freq: Once | INTRAMUSCULAR | Status: AC
  Administered 2013-01-15: 2 mg via INTRAMUSCULAR
  Filled 2013-01-15: qty 1

## 2013-01-15 MED ORDER — OXYCODONE HCL 5 MG PO TABS: 5.0000 mg | ORAL_TABLET | ORAL | Status: DC | PRN

## 2013-01-15 NOTE — ED Provider Notes (Signed)
History     CSN: [id number]  Arrival date & time 01/15/13  1347   First MD Initiated Contact with Patient 01/15/13 1520      Chief Complaint  Patient presents with  . Rectal Pain  . Rectal Bleeding    (Consider location/radiation/quality/duration/timing/severity/associated sxs/prior treatment) HPI  Cristian Anderson 37 year old male with past medical history of hypertension obesity and schizophrenia along with chronic constipation and recently diagnosed anal  to the emergency department with chief complaint of rectal pain.Patient was seen at  Our ED on and diagnosed with rectal fistulas.  The patient was prescribed Colace and sitz baths.  He is followed up with his primary care physician Ron Parker, MD recently added Anusol to the patient's treatment for his pain.  He states that he is having softer stools but complains of severe pain with defecation.  He has been having a small amount of blood however today he had no blood than usual with his bowel movement.  He is taking Advil for his rectal pain and has not been prescribed opiates due to the risk of severe constipation complicating his diagnosis. T   Past Medical History  Diagnosis Date  . Hypertension   . Schizophrenia   . Anxiety attack   . Panic attack   . Obesity     History reviewed. No pertinent past surgical history.  No family history on file.  History  Substance Use Topics  . Smoking status: Current Every Day Smoker  . Smokeless tobacco: Not on file  . Alcohol Use: No      Review of Systems Ten systems reviewed and are negative for acute change, except as noted in the HPI.   Allergies  Penicillins and Tylenol  Home Medications   Current Outpatient Rx  Name  Route  Sig  Dispense  Refill  . cyclobenzaprine (FLEXERIL) 5 MG tablet   Oral   Take 5 mg by mouth 3 (three) times daily as needed for muscle spasms.         Marland Kitchen docusate sodium (COLACE) 100 MG capsule   Oral   Take 1 capsule (100 mg total)  by mouth every 12 (twelve) hours.   14 capsule   0   . FLUoxetine (PROZAC) 40 MG capsule   Oral   Take 40 mg by mouth every morning.          . hydrochlorothiazide (HYDRODIURIL) 25 MG tablet   Oral   Take 25 mg by mouth every morning.           . hydrocortisone (ANUSOL-HC) 2.5 % rectal cream   Rectal   Place 1 application rectally 3 (three) times daily.         Marland Kitchen ibuprofen (ADVIL,MOTRIN) 200 MG tablet   Oral   Take 400 mg by mouth every 6 (six) hours as needed for pain.         Marland Kitchen lisinopril (PRINIVIL,ZESTRIL) 40 MG tablet   Oral   Take 40 mg by mouth at bedtime.         Marland Kitchen perphenazine (TRILAFON) 8 MG tablet   Oral   Take 16 mg by mouth 2 (two) times daily.           . potassium chloride (K-DUR,KLOR-CON) 10 MEQ tablet   Oral   Take 10 mEq by mouth every morning.          . pravastatin (PRAVACHOL) 10 MG tablet   Oral   Take 10 mg by mouth at bedtime.           Marland Kitchen  promethazine (PHENERGAN) 25 MG tablet   Oral   Take 25 mg by mouth every 6 (six) hours as needed for nausea.         Marland Kitchen QUEtiapine (SEROQUEL) 200 MG tablet   Oral   Take 400 mg by mouth 2 (two) times daily.           . ranitidine (ZANTAC) 150 MG tablet   Oral   Take 150 mg by mouth 2 (two) times daily.           BP 156/101  Pulse 87  Temp(Src) 97.7 F (36.5 C) (Oral)  Resp 17  SpO2 98%  Physical Exam  Nursing note and vitals reviewed. Constitutional: He appears well-developed and well-nourished. No distress.  Obese male  HENT:  Head: Normocephalic and atraumatic.  Eyes: Conjunctivae are normal. No scleral icterus.  Neck: Normal range of motion. Neck supple.  Cardiovascular: Normal rate, regular rhythm and normal heart sounds.   Pulmonary/Chest: Effort normal and breath sounds normal. No respiratory distress.  Abdominal: Soft. There is no tenderness.  Genitourinary: Rectal exam shows fissure and tenderness. Rectal exam shows no external hemorrhoid and anal tone normal.      Musculoskeletal: He exhibits no edema.  Neurological: He is alert.  Skin: Skin is warm and dry. He is not diaphoretic.  Psychiatric: His behavior is normal.    ED Course  Procedures (including critical care time)  Labs Reviewed - No data to display No results found.   No diagnosis found.    MDM  4:29 PM Patient with sever rectal pain. Palpable fissure at 6:00. I belive this would explain his increase in rectal pain and bleeding.  5:10 PM Patient continues to be in sever pain. I will give him a short course of pain medication and change him to a stimulant laxative as a temporary resolution for his pain. I  Suspect his hypertension is due to his rectal pain. Follow up with [facility].      Arthor Captain, PA-C 01/15/13 1718  Medical screening examination/treatment/procedure(s) were performed by non-physician practitioner and as supervising physician I was immediately available for consultation/collaboration.   Derwood Kaplan, MD 01/16/13 [phone]

## 2013-01-15 NOTE — ED Notes (Signed)
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 Pt followed up with Dr. Lovell Sheehan.  He was prescribed some cream and a stool softener.  Pt used the stool softener, but not cream.  Pt states stool softener had made stool softer, but he remains in pain.  Pt denies N/V/D.

## 2013-01-18 ENCOUNTER — Encounter: Payer: Self-pay | Admitting: Gastroenterology

## 2013-01-23 ENCOUNTER — Ambulatory Visit (INDEPENDENT_AMBULATORY_CARE_PROVIDER_SITE_OTHER): Payer: Medicaid Other | Admitting: Gastroenterology

## 2013-01-23 ENCOUNTER — Encounter: Payer: Self-pay | Admitting: Gastroenterology

## 2013-01-23 DIAGNOSIS — R195 Other fecal abnormalities: Secondary | ICD-10-CM

## 2013-01-23 DIAGNOSIS — K602 Anal fissure, unspecified: Secondary | ICD-10-CM

## 2013-01-23 MED ORDER — DILTIAZEM GEL 2 %: 1.0000 "application " | Freq: Three times a day (TID) | CUTANEOUS | Status: DC

## 2013-01-23 NOTE — Progress Notes (Signed)
History of Present Illness: This is a 37 year old male who I a evaluated once previously as an unassigned consult in 2007. At that time he had GERD and fatty liver. More recently he has been to the ED twice for rectal pain and rectal bleeding with an anal fissure noted at both ED visits. He relates severe rectal pain with bowel movements and a few episodes of bright red blood per rectum. He is been treated with Anusol cream, stool softeners, oxycodone and Aleve. Denies weight loss, abdominal pain, constipation, diarrhea, change in stool caliber, melena, nausea, vomiting, dysphagia, reflux symptoms, chest pain.  Review of Systems: Pertinent positive and negative review of systems were noted in the above HPI section. All other review of systems were otherwise negative.  Current Medications, Allergies, Past Medical History, Past Surgical History, Family History and Social History were reviewed in [employer] record.  Physical Exam: General: Well developed , well nourished, obese, no acute distress Head: Normocephalic and atraumatic Eyes:  sclerae anicteric, EOMI Ears: Normal auditory acuity Mouth: No deformity or lesions Neck: Supple, no masses or thyromegaly Lungs: Clear throughout to auscultation Heart: Regular rate and rhythm; no murmurs, rubs or bruits Abdomen: Soft, non tender and non distended. No masses, hepatosplenomegaly or hernias noted. Normal Bowel sounds Rectal: not repeated at patient request, anal fissure noted twice recently in ED, heme + stool noted. Musculoskeletal: Symmetrical with no gross deformities  Skin: No lesions on visible extremities Pulses:  Normal pulses noted Extremities: No clubbing, cyanosis, edema or deformities noted Neurological: Alert oriented x 4, grossly nonfocal Cervical Nodes:  No significant cervical adenopathy Inguinal Nodes: No significant inguinal adenopathy Psychological:  Alert and cooperative. Normal mood and  affect  Assessment and Recommendations:  1. Anal pain, hematochezia, heme + stool, anal fissure. Diltiazem 2% cream tid, colace bid. Consider colonoscopy when acute symptoms abate. Surgical referral if symptoms do not respond. Follow up in 8 weeks.

## 2013-01-23 NOTE — Patient Instructions (Addendum)
You have been given a separate informational sheet regarding your tobacco use, the importance of quitting and local resources to help you quit. Continue Colace stool softener. We have sent the following medications to your pharmacy for you to pick up at your convenience: Diltiazem Gel for anal fissure use for 8 weeks We have given you rectal care instructions. Return to see Dr Russella Dar in 8 weeks. CC:  Della Goo MD

## 2013-04-03 ENCOUNTER — Ambulatory Visit (INDEPENDENT_AMBULATORY_CARE_PROVIDER_SITE_OTHER): Payer: Medicaid Other | Admitting: Gastroenterology

## 2013-04-03 ENCOUNTER — Encounter: Payer: Self-pay | Admitting: Gastroenterology

## 2013-04-03 VITALS — BP 136/90 | HR 80 | Ht 67.0 in | Wt 261.1 lb

## 2013-04-03 DIAGNOSIS — K602 Anal fissure, unspecified: Secondary | ICD-10-CM

## 2013-04-03 DIAGNOSIS — R195 Other fecal abnormalities: Secondary | ICD-10-CM

## 2013-04-03 DIAGNOSIS — K921 Melena: Secondary | ICD-10-CM

## 2013-04-03 MED ORDER — SOD PICOSULFATE-MAG OX-CIT ACD 10-3.5-12 MG-GM-GM PO PACK: 1.0000 | PACK | ORAL | Status: DC

## 2013-04-03 NOTE — Patient Instructions (Addendum)
You have been given a separate informational sheet regarding your tobacco use, the importance of quitting and local resources to help you quit.  You have been scheduled for a colonoscopy with propofol. Please follow written instructions given to you at your visit today.  Please pick up your prep kit at the pharmacy within the next 1-3 days. If you use inhalers (even only as needed), please bring them with you on the day of your procedure. Your physician has requested that you go to www.startemmi.com and enter the access code given to you at your visit today. This web site gives a general overview about your procedure. However, you should still follow specific instructions given to you by our office regarding your preparation for the procedure.  cc: Della Goo, MD

## 2013-04-03 NOTE — Progress Notes (Signed)
History of Present Illness: This is a 37 year old male returning for followup of an anal fissure. He's been treated with diltiazem cream 2 months with substantial improvement in symptoms. He notes a small amount of rectal pain and rectal bleeding every few days.  Current Medications, Allergies, Past Medical History, Past Surgical History, Family History and Social History were reviewed in [employer] record.  Physical Exam: General: Well developed , well nourished, no acute distress Head: Normocephalic and atraumatic Eyes:  sclerae anicteric, EOMI Ears: Normal auditory acuity Mouth: No deformity or lesions Lungs: Clear throughout to auscultation Heart: Regular rate and rhythm; no murmurs, rubs or bruits Abdomen: Soft, non tender and non distended. No masses, hepatosplenomegaly or hernias noted. Normal Bowel sounds Rectal: deferred to colonoscopy Musculoskeletal: Symmetrical with no gross deformities  Extremities: No clubbing, cyanosis, edema or deformities noted Neurological: Alert oriented x 4, grossly nonfocal Psychological:  Alert and cooperative. Normal mood and affect  Assessment and Recommendations:  1. Anal fissure, heme + stool. Schedule colonoscopy. The risks, benefits, and alternatives to colonoscopy with possible biopsy and possible polypectomy were discussed with the patient and they consent to proceed.

## 2013-05-02 ENCOUNTER — Ambulatory Visit (AMBULATORY_SURGERY_CENTER): Payer: Medicaid Other | Admitting: Gastroenterology

## 2013-05-02 ENCOUNTER — Encounter: Payer: Self-pay | Admitting: Gastroenterology

## 2013-05-02 VITALS — BP 158/99 | HR 69 | Temp 97.6°F | Resp 13 | Ht 67.0 in | Wt 261.0 lb

## 2013-05-02 DIAGNOSIS — R195 Other fecal abnormalities: Secondary | ICD-10-CM

## 2013-05-02 DIAGNOSIS — K921 Melena: Secondary | ICD-10-CM

## 2013-05-02 MED ORDER — SODIUM CHLORIDE 0.9 % IV SOLN: 500.0000 mL | INTRAVENOUS | Status: DC

## 2013-05-02 NOTE — Op Note (Signed)
[facility] [address] [place], [address]   COLONOSCOPY PROCEDURE REPORT  PATIENT: Har[place], Bula  MR#: [id number] BIRTHDATE: Oct 13, 1975 , 37  yrs. old GENDER: Male ENDOSCOPIST: Meryl Dare, MD, [facility] PROCEDURE DATE:  05/02/2013 PROCEDURE:   Colonoscopy, diagnostic First Screening Colonoscopy - Avg.  risk and is 50 yrs.  old or older - No.  Prior Negative Screening - Now for repeat screening. N/A  History of Adenoma - Now for follow-up colonoscopy & has been > or = to 3 yrs.  N/A  Polyps Removed Today? No.  Recommend repeat exam, <10 yrs? No. ASA CLASS:   Class II INDICATIONS:hematochezia and heme-positive stool. MEDICATIONS: MAC sedation, administered by CRNA and propofol (Diprivan) 200mg  IV DESCRIPTION OF PROCEDURE:   After the risks benefits and alternatives of the procedure were thoroughly explained, informed consent was obtained.  A digital rectal exam revealed no abnormalities of the rectum.   The LB [id number]  endoscope was introduced through the anus and advanced to the cecum, which was identified by both the appendix and ileocecal valve. No adverse events experienced.   The quality of the prep was Prepopik good The instrument was then slowly withdrawn as the colon was fully examined.  COLON FINDINGS: Mild diverticulosis was noted in the ascending colon. Moderate melanosis was found throughout the entire examined colon. The colon was otherwise normal. There was no diverticulosis, inflammation, polyps or cancers unless previously stated. Retroflexed views revealed small internal hemorrhoids. The time to cecum=1 minutes 51 seconds.  Withdrawal time=8 minutes 43 seconds. The scope was withdrawn and the procedure completed.  COMPLICATIONS: There were no complications.  ENDOSCOPIC IMPRESSION: 1.   Mild diverticulosis was noted in the ascending colon 2.   Moderate melanosis throughout the colon 3.   Small internal  hemorrhoids  RECOMMENDATIONS: 1.  High fiber diet with liberal fluid intake. 2.  Continue current colorectal screening recommendations for "routine risk" patients with a repeat colonoscopy at age 65 years.  eSigned:  Meryl Dare, MD, [facility] 05/02/2013 1:46 PM

## 2013-05-02 NOTE — Progress Notes (Signed)
Patient did not experience any of the following events: a burn prior to discharge; a fall within the facility; wrong site/side/patient/procedure/implant event; or a hospital transfer or hospital admission upon discharge from the facility. (G8907) Patient did not have preoperative order for IV antibiotic SSI prophylaxis. (G8918)  

## 2013-05-02 NOTE — Progress Notes (Signed)
Report to pacu rn, vss, bbs=clear 

## 2013-05-02 NOTE — Patient Instructions (Addendum)
YOU HAD AN ENDOSCOPIC PROCEDURE TODAY AT THE [facility]: Refer to the procedure report that was given to you for any specific questions about what was found during the examination.  If the procedure report does not answer your questions, please call your gastroenterologist to clarify.  If you requested that your care partner not be given the details of your procedure findings, then the procedure report has been included in a sealed envelope for you to review at your convenience later.  YOU SHOULD EXPECT: Some feelings of bloating in the abdomen. Passage of more gas than usual.  Walking can help get rid of the air that was put into your GI tract during the procedure and reduce the bloating. If you had a lower endoscopy (such as a colonoscopy or flexible sigmoidoscopy) you may notice spotting of blood in your stool or on the toilet paper. If you underwent a bowel prep for your procedure, then you may not have a normal bowel movement for a few days.  DIET: Your first meal following the procedure should be a light meal and then it is ok to progress to your normal diet.  A half-sandwich or bowl of soup is an example of a good first meal.  Heavy or fried foods are harder to digest and may make you feel nauseous or bloated.  Likewise meals heavy in dairy and vegetables can cause extra gas to form and this can also increase the bloating.  Drink plenty of fluids but you should avoid alcoholic beverages for 24 hours.  ACTIVITY: Your care partner should take you home directly after the procedure.  You should plan to take it easy, moving slowly for the rest of the day.  You can resume normal activity the day after the procedure however you should NOT DRIVE or use heavy machinery for 24 hours (because of the sedation medicines used during the test).    SYMPTOMS TO REPORT IMMEDIATELY: A gastroenterologist can be reached at any hour.  During normal business hours, 8:30 AM to 5:00 PM Monday through Friday,  call [phone].  After hours and on weekends, please call the GI answering service at [phone] who will take a message and have the physician on call contact you.   Following lower endoscopy (colonoscopy or flexible sigmoidoscopy):  Excessive amounts of blood in the stool  Significant tenderness or worsening of abdominal pains  Swelling of the abdomen that is new, acute  Fever of 100F or higher  FOLLOW UP: If any biopsies were taken you will be contacted by phone or by letter within the next 1-3 weeks.  Call your gastroenterologist if you have not heard about the biopsies in 3 weeks.  Our staff will call the home number listed on your records the next business day following your procedure to check on you and address any questions or concerns that you may have at that time regarding the information given to you following your procedure. This is a courtesy call and so if there is no answer at the home number and we have not heard from you through the emergency physician on call, we will assume that you have returned to your regular daily activities without incident.  SIGNATURES/CONFIDENTIALITY: You and/or your care partner have signed paperwork which will be entered into your electronic medical record.  These signatures attest to the fact that that the information above on your After Visit Summary has been reviewed and is understood.  Full responsibility of the confidentiality of this   discharge information lies with you and/or your care-partner.   Resume medications. Information given on hemorrhoids and high fiber diet with discharge instructions. 

## 2013-05-03 ENCOUNTER — Telehealth: Payer: Self-pay

## 2013-05-03 NOTE — Telephone Encounter (Signed)
  Follow up Call-  Call back number 05/02/2013  Post procedure Call Back phone  # [phone]  Permission to leave phone message Yes     Patient questions:  Do you have a fever, pain , or abdominal swelling? no Pain Score  0 *  Have you tolerated food without any problems? yes  Have you been able to return to your normal activities? yes  Do you have any questions about your discharge instructions: Diet   no Medications  no Follow up visit  no  Do you have questions or concerns about your Care? no  Actions: * If pain score is 4 or above: No action needed, pain <4.  Per the pt, "everything went great". Maw

## 2014-01-28 ENCOUNTER — Emergency Department (HOSPITAL_COMMUNITY)
Admission: EM | Admit: 2014-01-28 | Discharge: 2014-01-30 | Disposition: A | Discharge status: Home / Self Care | Payer: Medicaid Other | Attending: Emergency Medicine | Admitting: Emergency Medicine

## 2014-01-28 ENCOUNTER — Encounter (HOSPITAL_COMMUNITY): Payer: Self-pay | Admitting: Emergency Medicine

## 2014-01-28 DIAGNOSIS — R45850 Homicidal ideations: Secondary | ICD-10-CM | POA: Insufficient documentation

## 2014-01-28 DIAGNOSIS — K219 Gastro-esophageal reflux disease without esophagitis: Secondary | ICD-10-CM | POA: Insufficient documentation

## 2014-01-28 DIAGNOSIS — F410 Panic disorder [episodic paroxysmal anxiety] without agoraphobia: Secondary | ICD-10-CM | POA: Insufficient documentation

## 2014-01-28 DIAGNOSIS — K7689 Other specified diseases of liver: Secondary | ICD-10-CM | POA: Insufficient documentation

## 2014-01-28 DIAGNOSIS — R45851 Suicidal ideations: Secondary | ICD-10-CM | POA: Insufficient documentation

## 2014-01-28 DIAGNOSIS — F209 Schizophrenia, unspecified: Secondary | ICD-10-CM | POA: Insufficient documentation

## 2014-01-28 DIAGNOSIS — F17200 Nicotine dependence, unspecified, uncomplicated: Secondary | ICD-10-CM | POA: Insufficient documentation

## 2014-01-28 DIAGNOSIS — E669 Obesity, unspecified: Secondary | ICD-10-CM | POA: Insufficient documentation

## 2014-01-28 DIAGNOSIS — I10 Essential (primary) hypertension: Secondary | ICD-10-CM | POA: Insufficient documentation

## 2014-01-28 DIAGNOSIS — Z9114 Patient's other noncompliance with medication regimen: Secondary | ICD-10-CM

## 2014-01-28 LAB — CBC
HEMATOCRIT: 42.4 % (ref 39.0–52.0)
HEMOGLOBIN: 15.1 g/dL (ref 13.0–17.0)
MCH: 30.5 pg (ref 26.0–34.0)
MCHC: 35.6 g/dL (ref 30.0–36.0)
MCV: 85.7 fL (ref 78.0–100.0)
Platelets: 239 10*3/uL (ref 150–400)
RBC: 4.95 MIL/uL (ref 4.22–5.81)
RDW: 13.2 % (ref 11.5–15.5)
WBC: 9.6 10*3/uL (ref 4.0–10.5)

## 2014-01-28 LAB — COMPREHENSIVE METABOLIC PANEL
ALK PHOS: 73 U/L (ref 39–117)
ALT: 24 U/L (ref 0–53)
AST: 18 U/L (ref 0–37)
Albumin: 4 g/dL (ref 3.5–5.2)
BUN: 16 mg/dL (ref 6–23)
CO2: 25 mEq/L (ref 19–32)
Calcium: 10.2 mg/dL (ref 8.4–10.5)
Chloride: 108 mEq/L (ref 96–112)
Creatinine, Ser: 0.99 mg/dL (ref 0.50–1.35)
GLUCOSE: 103 mg/dL — AB (ref 70–99)
Potassium: 3.6 mEq/L — ABNORMAL LOW (ref 3.7–5.3)
Sodium: 146 mEq/L (ref 137–147)
TOTAL PROTEIN: 7.3 g/dL (ref 6.0–8.3)
Total Bilirubin: 0.2 mg/dL — ABNORMAL LOW (ref 0.3–1.2)

## 2014-01-28 LAB — SALICYLATE LEVEL: Salicylate Lvl: <2 mg/dL — ABNORMAL LOW (ref 2.8–20.0)

## 2014-01-28 LAB — ACETAMINOPHEN LEVEL: Acetaminophen (Tylenol), Serum: <15 ug/mL (ref 10–30)

## 2014-01-28 LAB — ETHANOL

## 2014-01-28 NOTE — ED Notes (Signed)
Pt now complaint with plan of care. Family provided with visiting hours.

## 2014-01-28 NOTE — ED Notes (Signed)
Pt at this time refusing to have family leave or to change into paper scrubs. Pt made aware of [facility] policy.
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
## 2014-01-29 LAB — RAPID URINE DRUG SCREEN, HOSP PERFORMED
Amphetamines: NOT DETECTED
Barbiturates: NOT DETECTED
Benzodiazepines: NOT DETECTED
COCAINE: NOT DETECTED
Opiates: NOT DETECTED
Tetrahydrocannabinol: POSITIVE — AB

## 2014-01-29 MED ORDER — QUETIAPINE FUMARATE ER 300 MG PO TB24
300.0000 mg | ORAL_TABLET | Freq: Every day | ORAL | Status: DC
  Administered 2014-01-29: 300 mg via ORAL
  Filled 2014-01-29 (×3): qty 1

## 2014-01-29 MED ORDER — ALUM & MAG HYDROXIDE-SIMETH 200-200-20 MG/5ML PO SUSP
30.0000 mL | ORAL | Status: DC | PRN
  Filled 2014-01-29: qty 30

## 2014-01-29 MED ORDER — LISINOPRIL 20 MG PO TABS
40.0000 mg | ORAL_TABLET | Freq: Every day | ORAL | Status: DC
  Administered 2014-01-29 (×2): 40 mg via ORAL
  Filled 2014-01-29: qty 2
  Filled 2014-01-29: qty 1

## 2014-01-29 MED ORDER — PERPHENAZINE 8 MG PO TABS
8.0000 mg | ORAL_TABLET | Freq: Two times a day (BID) | ORAL | Status: DC
  Administered 2014-01-29 – 2014-01-30 (×3): 8 mg via ORAL
  Filled 2014-01-29 (×5): qty 1

## 2014-01-29 MED ORDER — ONDANSETRON HCL 4 MG PO TABS
4.0000 mg | ORAL_TABLET | Freq: Three times a day (TID) | ORAL | Status: DC | PRN
  Filled 2014-01-29: qty 1

## 2014-01-29 MED ORDER — NICOTINE 21 MG/24HR TD PT24
21.0000 mg | MEDICATED_PATCH | Freq: Every day | TRANSDERMAL | Status: DC
  Filled 2014-01-29: qty 1

## 2014-01-29 MED ORDER — POTASSIUM CHLORIDE CRYS ER 20 MEQ PO TBCR
40.0000 meq | EXTENDED_RELEASE_TABLET | Freq: Once | ORAL | Status: AC
  Administered 2014-01-29: 40 meq via ORAL
  Filled 2014-01-29: qty 2

## 2014-01-29 MED ORDER — FLUOXETINE HCL 20 MG PO CAPS
60.0000 mg | ORAL_CAPSULE | Freq: Every day | ORAL | Status: DC
  Administered 2014-01-29 – 2014-01-30 (×2): 60 mg via ORAL
  Filled 2014-01-29 (×3): qty 3

## 2014-01-29 MED ORDER — IBUPROFEN 400 MG PO TABS
600.0000 mg | ORAL_TABLET | Freq: Three times a day (TID) | ORAL | Status: DC | PRN
  Administered 2014-01-30: 600 mg via ORAL
  Filled 2014-01-29 (×2): qty 1

## 2014-01-29 MED ORDER — ZOLPIDEM TARTRATE 5 MG PO TABS: 5.0000 mg | ORAL_TABLET | Freq: Every evening | ORAL | Status: DC | PRN

## 2014-01-29 MED ORDER — LORAZEPAM 1 MG PO TABS
1.0000 mg | ORAL_TABLET | Freq: Three times a day (TID) | ORAL | Status: DC | PRN
  Filled 2014-01-29: qty 1
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
## 2014-01-29 NOTE — Progress Notes (Signed)
Pt is currently off of CPAP and talking to tech. Pt made aware to call if he needs any help getting CPAP back on for sleep. RT will continue to monitor.

## 2014-01-29 NOTE — BH Assessment (Signed)
Clinician called girlfriend, no answer, unable to leave voicemail. Clinician called therapist, left voicemail for f/u.  Yaakov Guthrie, MSW, LCSW Triage Specialist [phone]

## 2014-01-29 NOTE — ED Notes (Signed)
Pt. States, "I don't need anything for pain, I'm okay"
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
## 2014-01-29 NOTE — ED Notes (Signed)
Called Respiratory, Thelma Barge is bringing CPAP down for the patient.

## 2014-01-29 NOTE — ED Notes (Signed)
Pt. s mother and therapist at the bedside.

## 2014-01-29 NOTE — ED Notes (Signed)
Pt.s girlfriend Jolaine Artist is speaking to [place], [place].  Pt.s girlfriends number is [phone].  Tyler Aas will talk with Behavioral Health.   Pt.'s mother informed us that pt. Will be going home with her today. Pt.s therapist number is [phone].

## 2014-01-29 NOTE — BH Assessment (Signed)
Clinician consulted with Alberteen Sam NP who suggested to defer disposition until follow up with pt's girlfriend Pura Spice) about presenting event.  Also need to f/u with therapist Milagros Evener) for psychiatric information.   Yaakov Guthrie, MSW, LCSW Triage Specialist [phone]
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
## 2014-01-29 NOTE — ED Notes (Signed)
Respiratory arrived with CPAP machine.

## 2014-01-29 NOTE — BH Assessment (Signed)
Pt provided phone numbers for collateral contacts: Pt's mother Bradly Chris [phone] Pt's girlfriend Pura Spice [phone]  Yaakov Guthrie, MSW, LCSW Triage Specialist [phone]

## 2014-01-29 NOTE — Progress Notes (Signed)
CSW spoke with pt.'s girlfriend, Jolaine Artist [phone] who reported that pt can return home as long as he starts taking his medication. Pt reported that he stopped taking his medication because it made him feel drowsy. CSW spoke with pt on the importance of taking his medication as prescribed. Pt.'s mother and therapist, Milagros Evener [phone] at bedside. Pt reported that he has not been taking for 2 months. According to [facility], NP pt will be observed for 24 hours restarted on psychotropic medications and reassessed on 01/30/14 for possible discharge. Pt made aware of plan and is in agreement. CSW is being followed by [facility], Dr. Omelia Blackwater [phone] for medication management. Pt has been scheduled for 02/01/14 at 4:00 pm for follow up medication management. Med list faxed from [facility] given to attending RN.   [address] [place] [phone]

## 2014-01-29 NOTE — Progress Notes (Signed)
01/29/14 0400  BiPAP/CPAP/SIPAP  BiPAP/CPAP/SIPAP Pt Type Adult  Mask Type Full face mask  Mask Size Large  Respiratory Rate 18 breaths/min  IPAP 12 cmH20  EPAP 12 cmH2O  Oxygen Percent 21 %  BiPAP/CPAP/SIPAP CPAP  Patient Home Equipment No  Auto Titrate No  Patient placed on CPAP Full Face Mask per home settings. He tolerates it very well at this  Time.

## 2014-01-29 NOTE — ED Notes (Signed)
Pt requesting CPAP to help him sleep, will inform MD.

## 2014-01-29 NOTE — BH Assessment (Signed)
Clinician provided updates to Dr. Preston Fleeting.   Yaakov Guthrie, MSW, LCSW Triage Specialist [phone]
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
## 2014-01-30 DIAGNOSIS — F209 Schizophrenia, unspecified: Secondary | ICD-10-CM

## 2014-01-30 NOTE — ED Notes (Signed)
AWAITING SOCIAL WORK TO ASSIST IN REFERRAL TO IOP

## 2014-01-30 NOTE — ED Notes (Signed)
PT VALUABLES AND BELONGINGS RETURNED TO PT AND HE HAS SIGNED THE INVENTORY SHEETS.

## 2014-01-30 NOTE — Discharge Instructions (Signed)
Please keep appointments as scheduled. Take your medications as prescribed.

## 2014-01-30 NOTE — Consult Note (Signed)
Case discussed, agree with plan 
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
## 2014-01-30 NOTE — ED Notes (Signed)
telepsych completed 

## 2014-01-30 NOTE — Progress Notes (Signed)
Patient currently wearing CPAP at this time.

## 2014-11-04 ENCOUNTER — Emergency Department (HOSPITAL_COMMUNITY)
Admission: EM | Admit: 2014-11-04 | Discharge: 2014-11-04 | Disposition: A | Discharge status: Home / Self Care | Payer: Medicaid Other | Attending: Emergency Medicine | Admitting: Emergency Medicine

## 2014-11-04 ENCOUNTER — Encounter (HOSPITAL_COMMUNITY): Payer: Self-pay | Admitting: Nurse Practitioner

## 2014-11-04 DIAGNOSIS — E669 Obesity, unspecified: Secondary | ICD-10-CM | POA: Diagnosis not present

## 2014-11-04 DIAGNOSIS — Z720 Tobacco use: Secondary | ICD-10-CM | POA: Insufficient documentation

## 2014-11-04 DIAGNOSIS — Z8659 Personal history of other mental and behavioral disorders: Secondary | ICD-10-CM | POA: Diagnosis not present

## 2014-11-04 DIAGNOSIS — Y998 Other external cause status: Secondary | ICD-10-CM | POA: Insufficient documentation

## 2014-11-04 DIAGNOSIS — I10 Essential (primary) hypertension: Secondary | ICD-10-CM | POA: Diagnosis not present

## 2014-11-04 DIAGNOSIS — Z880 Allergy status to penicillin: Secondary | ICD-10-CM | POA: Insufficient documentation

## 2014-11-04 DIAGNOSIS — Z8719 Personal history of other diseases of the digestive system: Secondary | ICD-10-CM | POA: Diagnosis not present

## 2014-11-04 DIAGNOSIS — X781XXA Intentional self-harm by knife, initial encounter: Secondary | ICD-10-CM | POA: Insufficient documentation

## 2014-11-04 DIAGNOSIS — Z23 Encounter for immunization: Secondary | ICD-10-CM | POA: Insufficient documentation

## 2014-11-04 DIAGNOSIS — Y9289 Other specified places as the place of occurrence of the external cause: Secondary | ICD-10-CM | POA: Insufficient documentation

## 2014-11-04 DIAGNOSIS — S41112A Laceration without foreign body of left upper arm, initial encounter: Secondary | ICD-10-CM | POA: Insufficient documentation

## 2014-11-04 DIAGNOSIS — Z79899 Other long term (current) drug therapy: Secondary | ICD-10-CM | POA: Diagnosis not present

## 2014-11-04 DIAGNOSIS — F4310 Post-traumatic stress disorder, unspecified: Secondary | ICD-10-CM | POA: Insufficient documentation

## 2014-11-04 DIAGNOSIS — T148XXA Other injury of unspecified body region, initial encounter: Secondary | ICD-10-CM

## 2014-11-04 DIAGNOSIS — Y9389 Activity, other specified: Secondary | ICD-10-CM | POA: Diagnosis not present

## 2014-11-04 DIAGNOSIS — S59912A Unspecified injury of left forearm, initial encounter: Secondary | ICD-10-CM | POA: Diagnosis present

## 2014-11-04 MED ORDER — TETANUS-DIPHTH-ACELL PERTUSSIS 5-2.5-18.5 LF-MCG/0.5 IM SUSP
0.5000 mL | Freq: Once | INTRAMUSCULAR | Status: AC
  Administered 2014-11-04: 0.5 mL via INTRAMUSCULAR
  Filled 2014-11-04: qty 0.5

## 2014-11-04 MED ORDER — LORAZEPAM 1 MG PO TABS: 2.0000 mg | ORAL_TABLET | Freq: Once | ORAL | Status: DC

## 2014-11-04 MED ORDER — BACITRACIN 500 UNIT/GM EX OINT
1.0000 "application " | TOPICAL_OINTMENT | CUTANEOUS | Status: DC | PRN
  Administered 2014-11-04: 1 via TOPICAL

## 2014-11-04 NOTE — ED Provider Notes (Addendum)
CSN: [id number]     Arrival date & time 11/04/14  1643 History   First MD Initiated Contact with Patient 11/04/14 1727     Chief Complaint  Patient presents with  . Injury     (Consider location/radiation/quality/duration/timing/severity/associated sxs/prior Treatment) HPI Comments: Patient says today he cut his left arm with a pocket knife just to see if it would hurt. He came to the ER to get it checked out to make sure everything was okay. He denies wanting to cut himself deeply or kill himself. He does have a history of schizophrenia and pain attacks. He takes medication for these regularly. He denies any suicidal ideation or homicidal ideation at this time. He says that he tried to commit suicide years ago but has not had any of those thoughts recently. Patient does have a therapist and sees a psychiatrist regularly. Patient is accompanied by his family who states that he has seemed more down lately. Patient denies feeling more depressed and continually states he once to go home. Unknown last tetanus  Patient is a 39 y.o. male presenting with injury. The history is provided by the patient.  Injury This is a new problem. The current episode started 1 to 2 hours ago. The problem occurs constantly. The problem has not changed since onset.Nothing aggravates the symptoms.    Past Medical History  Diagnosis Date  . Hypertension   . Schizophrenia   . Anxiety attack   . Panic attack   . Obesity   . GERD (gastroesophageal reflux disease)   . Fatty liver    History reviewed. No pertinent past surgical history. Family History  Problem Relation Age of Onset  . Diabetes Father   . Colon polyps Brother   . Heart disease Maternal Aunt    History  Substance Use Topics  . Smoking status: Current Every Day Smoker -- 1.50 packs/day    Types: Cigarettes  . Smokeless tobacco: Never Used  . Alcohol Use: No    Review of Systems  All other systems reviewed and are negative.     Allergies   Penicillins and Tylenol  Home Medications   Prior to Admission medications   Medication Sig Start Date End Date Taking? Authorizing Provider  lisinopril (PRINIVIL,ZESTRIL) 40 MG tablet Take 40 mg by mouth at bedtime.    Historical Provider, MD   BP 158/105 mmHg  Pulse 89  Temp(Src) 98.8 F (37.1 C) (Oral)  Resp 20  SpO2 97% Physical Exam  Constitutional: He is oriented to person, place, and time. He appears well-developed and well-nourished. No distress.  HENT:  Head: Normocephalic and atraumatic.  Mouth/Throat: Oropharynx is clear and moist.  Eyes: Conjunctivae and EOM are normal. Pupils are equal, round, and reactive to light.  Neck: Normal range of motion. Neck supple.  Cardiovascular: Normal rate, regular rhythm and intact distal pulses.   No murmur heard. Pulmonary/Chest: Effort normal and breath sounds normal. No respiratory distress. He has no wheezes. He has no rales.  Abdominal: Soft. He exhibits no distension. There is no tenderness. There is no rebound and no guarding.  Musculoskeletal: Normal range of motion. He exhibits no edema or tenderness.       Left forearm: He exhibits laceration.       Arms: Neurological: He is alert and oriented to person, place, and time.  Skin: Skin is warm and dry. No rash noted. No erythema.  Psychiatric: He has a normal mood and affect. His behavior is normal. He expresses no homicidal and  no suicidal ideation.  Slightly guarded  Nursing note and vitals reviewed.   ED Course  Procedures (including critical care time) Labs Review Labs Reviewed - No data to display  Imaging Review No results found.   EKG Interpretation None      MDM   Final diagnoses:  Superficial laceration of skin    Patient with a history of schizophrenia, anxiety attacks who presents today after cutting his left arm superficially. He states he was just cutting it to see if it would hurt. He said he definitely did not want to kill himself or hurt  anyone else. He came in just to get his arm checked out. Family is with the patient and says that recently he seemed a little bit more down but no suicide attempts in years. In stance he does not want to go to behavioral health he has an appointment with his therapist on Tuesday as well as a doctor's appointment and he just once to go home. We'll discuss with his mother and his therapist Pat  6:10 PM Spoke with Ms. Pat who felt that pt can go home and has appt tomorrow evening.  Mom and aunt are happy with this.  Based on pt's hx and exam do not feel that he meets IVC or has to stay.  Will d/c home.  Gwyneth Sprout, MD 11/04/14 1811  Gwyneth Sprout, MD 11/04/14 [id number]

## 2014-11-04 NOTE — ED Notes (Signed)
Plunkett, MD states he does not need to be SI/HI precautions based on her assessment of him and the injury.

## 2014-11-04 NOTE — ED Notes (Signed)
Pt with multiple superficial laceration to L posterior forearm. He states he cut himself this morning with a knife because he wanted to see what it felt like. He says he would not have cut himself anywhere that would have been too painful. The lacerations are dry with scabs. He said he did not want to kill himself and that he has not thoughts of SI. Also denies HI. He denies substance use. Denies physical complaints

## 2014-11-04 NOTE — ED Notes (Signed)
The pt refusing to wear scrubs. He says he just wants to go home. Pt taken straight to ed exam room for MD evaluation. RN and EDP made aware

## 2016-09-14 ENCOUNTER — Emergency Department (HOSPITAL_COMMUNITY)
Admission: EM | Admit: 2016-09-14 | Discharge: 2016-09-14 | Disposition: A | Discharge status: Home / Self Care | Payer: Medicaid Other | Attending: Emergency Medicine | Admitting: Emergency Medicine

## 2016-09-14 ENCOUNTER — Emergency Department (HOSPITAL_COMMUNITY): Payer: Medicaid Other

## 2016-09-14 ENCOUNTER — Encounter (HOSPITAL_COMMUNITY): Payer: Self-pay | Admitting: Emergency Medicine

## 2016-09-14 DIAGNOSIS — K5900 Constipation, unspecified: Secondary | ICD-10-CM | POA: Insufficient documentation

## 2016-09-14 DIAGNOSIS — R109 Unspecified abdominal pain: Secondary | ICD-10-CM | POA: Diagnosis present

## 2016-09-14 DIAGNOSIS — F17210 Nicotine dependence, cigarettes, uncomplicated: Secondary | ICD-10-CM | POA: Insufficient documentation

## 2016-09-14 DIAGNOSIS — Z79899 Other long term (current) drug therapy: Secondary | ICD-10-CM | POA: Insufficient documentation

## 2016-09-14 DIAGNOSIS — I10 Essential (primary) hypertension: Secondary | ICD-10-CM | POA: Diagnosis not present

## 2016-09-14 LAB — COMPREHENSIVE METABOLIC PANEL
ALT: 23 U/L (ref 17–63)
AST: 16 U/L (ref 15–41)
Albumin: 4.1 g/dL (ref 3.5–5.0)
Alkaline Phosphatase: 56 U/L (ref 38–126)
Anion gap: 7 (ref 5–15)
BUN: 9 mg/dL (ref 6–20)
CHLORIDE: 107 mmol/L (ref 101–111)
CO2: 25 mmol/L (ref 22–32)
CREATININE: 1.1 mg/dL (ref 0.61–1.24)
Calcium: 9.4 mg/dL (ref 8.9–10.3)
GFR calc Af Amer: >60 mL/min (ref >60)
GFR calc non Af Amer: >60 mL/min (ref >60)
Glucose, Bld: 91 mg/dL (ref 65–99)
Potassium: 4.4 mmol/L (ref 3.5–5.1)
SODIUM: 139 mmol/L (ref 135–145)
Total Bilirubin: 0.6 mg/dL (ref 0.3–1.2)
Total Protein: 6.9 g/dL (ref 6.5–8.1)

## 2016-09-14 LAB — CBC
HCT: 43.4 % (ref 39.0–52.0)
Hemoglobin: 14.8 g/dL (ref 13.0–17.0)
MCH: 30.3 pg (ref 26.0–34.0)
MCHC: 34.1 g/dL (ref 30.0–36.0)
MCV: 88.9 fL (ref 78.0–100.0)
PLATELETS: 250 10*3/uL (ref 150–400)
RBC: 4.88 MIL/uL (ref 4.22–5.81)
RDW: 13.4 % (ref 11.5–15.5)
WBC: 8.4 10*3/uL (ref 4.0–10.5)

## 2016-09-14 LAB — POC OCCULT BLOOD, ED: Fecal Occult Bld: NEGATIVE

## 2016-09-14 LAB — URINALYSIS, ROUTINE W REFLEX MICROSCOPIC
Bilirubin Urine: NEGATIVE
Glucose, UA: NEGATIVE mg/dL
KETONES UR: NEGATIVE mg/dL
Leukocytes, UA: NEGATIVE
Nitrite: NEGATIVE
PH: 6 (ref 5.0–8.0)
Protein, ur: NEGATIVE mg/dL
SPECIFIC GRAVITY, URINE: 1.039 — AB (ref 1.005–1.030)

## 2016-09-14 LAB — LIPASE, BLOOD: LIPASE: 17 U/L (ref 11–51)

## 2016-09-14 MED ORDER — POLYETHYLENE GLYCOL 3350 17 G PO PACK: 17.0000 g | PACK | Freq: Every day | ORAL | 0 refills | Status: DC

## 2016-09-14 MED ORDER — FLEET ENEMA 7-19 GM/118ML RE ENEM: 1.0000 | ENEMA | Freq: Once | RECTAL | 0 refills | Status: AC

## 2016-09-14 MED ORDER — IOPAMIDOL (ISOVUE-300) INJECTION 61%
INTRAVENOUS | Status: AC
  Administered 2016-09-14: 100 mL
  Filled 2016-09-14: qty 100

## 2016-09-14 NOTE — ED Triage Notes (Signed)
Pt sts abd pain and constipation x 2 weeks; pt sts OTC meds not helping

## 2016-09-14 NOTE — ED Provider Notes (Signed)
[facility]Y DEPT Provider Note   CSN: [id number] Arrival date & time: 09/14/16  1058     History   Chief Complaint Chief Complaint  Patient presents with  . Abdominal Pain  . Constipation    HPI Cristian Anderson is a 41 y.o. male.  HPI Cristian Anderson is a 41 y.o. male with PMH significant for fatty liver, GERD, HTN, obesity, schizophrenia, and panic attack who presents with 2 weeks of gradual onset, constant, unchanging left sided abdominal pain he is unable to describe with associated constipation.  Last BM 2 weeks ago.  He reports rectal pain and tenesmus.  He reports hx of anal fissure.  No fever, chills, N/V, bloody stools, or urinary symptoms.  He has tried colace and laxatives with no relief.  No changes in diet or medications.  Past Medical History:  Diagnosis Date  . Anxiety attack   . Fatty liver   . GERD (gastroesophageal reflux disease)   . Hypertension   . Obesity   . Panic attack   . Schizophrenia (HCC)     There are no active problems to display for this patient.   History reviewed. No pertinent surgical history.     Home Medications    Prior to Admission medications   Medication Sig Start Date End Date Taking? Authorizing Provider  ALPRAZolam Prudy Feeler(XANAX) 0.5 MG tablet Take 0.5 mg by mouth at bedtime.    Historical Provider, MD  FLUoxetine (PROZAC) 20 MG capsule Take 20 mg by mouth 4 (four) times daily.    Historical Provider, MD  lisinopril (PRINIVIL,ZESTRIL) 40 MG tablet Take 40 mg by mouth at bedtime.    Historical Provider, MD  polyethylene glycol (MIRALAX) packet Take 17 g by mouth daily. 09/14/16   Cheri FowlerKayla Donneisha Beane, PA-C  QUEtiapine (SEROQUEL) 400 MG tablet Take 400 mg by mouth at bedtime.    Historical Provider, MD  sodium phosphate (FLEET) 7-19 GM/118ML ENEM Place 133 mLs (1 enema total) rectally once. 09/14/16 09/14/16  Cheri FowlerKayla Prezley Qadir, PA-C    Family History Family History  Problem Relation Age of Onset  . Diabetes Father   . Colon polyps Brother   . Heart  disease Maternal Aunt     Social History Social History  Substance Use Topics  . Smoking status: Current Every Day Smoker    Packs/day: 1.50    Types: Cigarettes  . Smokeless tobacco: Never Used  . Alcohol use No     Allergies   Penicillins and Tylenol [acetaminophen]   Review of Systems Review of Systems All other systems negative unless otherwise stated in HPI   Physical Exam Updated Vital Signs BP 148/86 (BP Location: Left Arm)   Pulse 81   Temp 99.1 F (37.3 C) (Oral)   Resp 16   SpO2 98%   Physical Exam  Constitutional: He is oriented to person, place, and time. He appears well-developed and well-nourished.  Non-toxic appearance. He does not have a sickly appearance. He does not appear ill.  Obese.   HENT:  Head: Normocephalic and atraumatic.  Mouth/Throat: Oropharynx is clear and moist.  Eyes: Conjunctivae are normal.  Neck: Normal range of motion. Neck supple.  Cardiovascular: Normal rate and regular rhythm.   Pulmonary/Chest: Effort normal and breath sounds normal. No accessory muscle usage or stridor. No respiratory distress. He has no wheezes. He has no rhonchi. He has no rales.  Abdominal: Soft. Bowel sounds are normal. He exhibits no distension. There is no tenderness.  Genitourinary: Rectal exam shows guaiac negative stool.  Genitourinary Comments: Proximal moderate palpable malleable stool in the rectal vault.   Musculoskeletal: Normal range of motion.  Lymphadenopathy:    He has no cervical adenopathy.  Neurological: He is alert and oriented to person, place, and time.  Speech clear without dysarthria.  Skin: Skin is warm and dry.  Psychiatric: He has a normal mood and affect. His behavior is normal.     ED Treatments / Results  Labs (all labs ordered are listed, but only abnormal results are displayed) Labs Reviewed  URINALYSIS, ROUTINE W REFLEX MICROSCOPIC - Abnormal; Notable for the following:       Result Value   APPearance HAZY (*)     Specific Gravity, Urine 1.039 (*)    Hgb urine dipstick SMALL (*)    Bacteria, UA RARE (*)    Squamous Epithelial / LPF 6-30 (*)    All other components within normal limits  LIPASE, BLOOD  COMPREHENSIVE METABOLIC PANEL  CBC  POC OCCULT BLOOD, ED    EKG  EKG Interpretation None       Radiology Ct Abdomen Pelvis W Contrast  Result Date: 09/14/2016 CLINICAL DATA:  Generalized abdominal pain and constipation for 2 weeks. EXAM: CT ABDOMEN AND PELVIS WITH CONTRAST TECHNIQUE: Multidetector CT imaging of the abdomen and pelvis was performed using the standard protocol following bolus administration of intravenous contrast. CONTRAST:  ISOVUE-300 IOPAMIDOL (ISOVUE-300) INJECTION 61% COMPARISON:  None. FINDINGS: Lower chest:  Unremarkable. Hepatobiliary: The liver shows diffusely decreased attenuation suggesting steatosis. There is no evidence for gallstones, gallbladder wall thickening, or pericholecystic fluid. No intrahepatic or extrahepatic biliary dilation. Pancreas: No focal mass lesion. No dilatation of the main duct. No intraparenchymal cyst. No peripancreatic edema. Spleen: No splenomegaly. No focal mass lesion. Adrenals/Urinary Tract: No adrenal nodule or mass. Kidneys are unremarkable. No evidence for hydroureter. The urinary bladder appears normal for the degree of distention. Stomach/Bowel: Stomach is nondistended. No gastric wall thickening. No evidence of outlet obstruction. Duodenum is normally positioned as is the ligament of Treitz. No small bowel wall thickening. No small bowel dilatation. The terminal ileum is normal. The appendix is normal. No gross colonic mass. No colonic wall thickening. No substantial diverticular change. Vascular/Lymphatic: No abdominal aortic aneurysm. No abdominal aortic atherosclerotic calcification. There is no gastrohepatic or hepatoduodenal ligament lymphadenopathy. No intraperitoneal or retroperitoneal lymphadenopathy. No pelvic sidewall  lymphadenopathy. Reproductive: The prostate gland and seminal vesicles have normal imaging features. Other: No intraperitoneal free fluid. Musculoskeletal: Bone windows reveal no worrisome lytic or sclerotic osseous lesions. IMPRESSION: No acute findings. Specifically, no findings to explain the patient's history of abdominal pain and constipation. Electronically Signed   By: Kennith Center M.D.   On: 09/14/2016 16:46   Dg Abdomen Acute W/chest  Result Date: 09/14/2016 CLINICAL DATA:  41 year old male with a history of abdominal pain and constipation EXAM: DG ABDOMEN ACUTE W/ 1V CHEST COMPARISON:  01/12/2011, 01/17/2009 FINDINGS: Chest: Cardiomediastinal silhouette unchanged. No evidence of central vascular congestion. No pneumothorax or pleural effusion.  No confluent airspace disease. Abdomen: Gas within small bowel and colon. Moderate stool burden. No abnormally distended small bowel or colon. No unexpected radiopaque foreign body. No unexpected soft tissue density. No unexpected abdominal a calcification. Vague density in the right aspect of the anatomic pelvis measuring 7 mm. No displaced fracture. IMPRESSION: Chest: No radiographic evidence of acute cardiopulmonary disease. Abdomen: Nonobstructive bowel gas pattern with moderate stool burden. 7 mm density in the right anatomic pelvis. If there was a concern for right-sided nephrolithiasis/ renal  colic, correlation with urinalysis may be useful. Differential includes pelvic phleboliths or density within the fecal stream. Signed, Yvone Neu. Loreta Ave, DO [facility] Electronically Signed   By: Gilmer Mor D.O.   On: 09/14/2016 13:20    Procedures Procedures (including critical care time)  Medications Ordered in ED Medications  iopamidol (ISOVUE-300) 61 % injection (100 mLs  Contrast Given 09/14/16 1627)     Initial Impression / Assessment and Plan / ED Course  I have reviewed the triage vital  signs and the nursing notes.  Pertinent labs & imaging results that were available during my care of the patient were reviewed by me and considered in my medical decision making (see chart for details).     Patient presents with findings c/w constipation.  Labs without acute abnormalities.  CT obtained to evaluate for obstruction and is unremarkable.  Does have stool within rectum; however, it is more proximal and is not amenable to manual disimpaction.  Plan to discharge home with enema and Miralax.  Discussed proper Miralax dosing.  Discussed return precautions including inability to have a BM.  Patient feels comfortable with the above plan.  Stable for discharge.   Case has been discussed with Dr. Jacqulyn Bath who agrees with the above plan for discharge.    Final Clinical Impressions(s) / ED Diagnoses   Final diagnoses:  Constipation, unspecified constipation type    New Prescriptions New Prescriptions   POLYETHYLENE GLYCOL (MIRALAX) PACKET    Take 17 g by mouth daily.   SODIUM PHOSPHATE (FLEET) 7-19 GM/118ML ENEM    Place 133 mLs (1 enema total) rectally once.     Cheri Fowler, PA-C 09/14/16 1741    Maia Plan, MD 09/15/16 1105

## 2016-09-14 NOTE — ED Notes (Signed)
ED Provider at bedside. 

## 2016-09-14 NOTE — Discharge Instructions (Signed)
You have a small amount of stool in your rectum which is causing your symptoms.  Go home and perform a Fleet enema and take 1 cap of MiraLax, wait 1-2 hours, if you do not have a bowel movement then take another cap.  Wait 1-2 hours, and if you do not have another bowel movement, take another cap full.  Return to the ED if you do not have a bowel movement.  Follow up with your primary care doctor as needed.  Continue taking 1 capfull of miralax daily.

## 2016-09-14 NOTE — ED Notes (Signed)
Pt returned from CT °

## 2016-09-14 NOTE — ED Notes (Signed)
Patient transported to CT 

## 2017-02-21 ENCOUNTER — Encounter (HOSPITAL_COMMUNITY): Payer: Self-pay | Admitting: *Deleted

## 2017-02-21 ENCOUNTER — Emergency Department (HOSPITAL_COMMUNITY)
Admission: EM | Admit: 2017-02-21 | Discharge: 2017-02-21 | Disposition: A | Discharge status: Home / Self Care | Payer: Medicaid Other | Attending: Emergency Medicine | Admitting: Emergency Medicine

## 2017-02-21 DIAGNOSIS — F17210 Nicotine dependence, cigarettes, uncomplicated: Secondary | ICD-10-CM | POA: Insufficient documentation

## 2017-02-21 DIAGNOSIS — F419 Anxiety disorder, unspecified: Secondary | ICD-10-CM | POA: Diagnosis not present

## 2017-02-21 DIAGNOSIS — T7840XA Allergy, unspecified, initial encounter: Secondary | ICD-10-CM | POA: Diagnosis not present

## 2017-02-21 DIAGNOSIS — I10 Essential (primary) hypertension: Secondary | ICD-10-CM | POA: Diagnosis not present

## 2017-02-21 DIAGNOSIS — R21 Rash and other nonspecific skin eruption: Secondary | ICD-10-CM | POA: Diagnosis present

## 2017-02-21 DIAGNOSIS — F209 Schizophrenia, unspecified: Secondary | ICD-10-CM | POA: Insufficient documentation

## 2017-02-21 MED ORDER — HYDROCORTISONE 1 % EX CREA: TOPICAL_CREAM | CUTANEOUS | 0 refills | Status: DC

## 2017-02-21 MED ORDER — DIPHENHYDRAMINE HCL 25 MG PO CAPS: 25.0000 mg | ORAL_CAPSULE | Freq: Four times a day (QID) | ORAL | 0 refills | Status: DC | PRN

## 2017-02-21 NOTE — ED Provider Notes (Signed)
[facility] DEPT Provider Note   By signing my name below, I, Earmon Phoenix, attest that this documentation has been prepared under the direction and in the presence of Graciella Freer, PA-C. Electronically Signed: Earmon Phoenix, ED Scribe. 02/21/17. 10:40 AM.    History   Chief Complaint Chief Complaint  Patient presents with  . Rash  . Allergic Reaction   The history is provided by the patient and medical records. No language interpreter was used.    Cristian Anderson is a 41 y.o. male who presents to the Emergency Department complaining of a rash to the RUE and right side that began yesterday. He reports associated itching. He states he was sitting outside yesterday morning and felt a "poking" sensation like a bug was biting him but never saw anything on him. He has not taken anything for his symptoms. He has applied calamine lotion with minimal relief. There are no modifying factors noted. He denies fever, chills, difficulty swallowing or breathing, nausea, vomiting, tongue or throat swelling. He denies any new lotions, creams, detergents, personal care items, foods or medications. He denies ever using an EpiPen in the past. He reports allergies to seafood, Tylenol and PCN but denies having any of these. He denies h/o DM. He denies being around any plants or being in the wounds. His next appt with his PCP is 03/12/17.   Past Medical History:  Diagnosis Date  . Anxiety attack   . Fatty liver   . GERD (gastroesophageal reflux disease)   . Hypertension   . Obesity   . Panic attack   . Schizophrenia (HCC)     There are no active problems to display for this patient.   History reviewed. No pertinent surgical history.     Home Medications    Prior to Admission medications   Medication Sig Start Date End Date Taking? Authorizing Provider  ALPRAZolam Prudy Feeler) 0.5 MG tablet Take 0.5 mg by mouth at bedtime.    [provider]  diphenhydrAMINE (BENADRYL) 25 mg capsule  Take 1 capsule (25 mg total) by mouth every 6 (six) hours as needed. 02/21/17   Maxwell Caul, PA-C  FLUoxetine (PROZAC) 20 MG capsule Take 20 mg by mouth 4 (four) times daily.    [provider]  hydrocortisone cream 1 % Apply to affected area 2 times daily 02/21/17   Graciella Freer A, PA-C  lisinopril (PRINIVIL,ZESTRIL) 40 MG tablet Take 40 mg by mouth at bedtime.    [provider]  polyethylene glycol (MIRALAX) packet Take 17 g by mouth daily. 09/14/16   Cheri Fowler, PA-C  QUEtiapine (SEROQUEL) 400 MG tablet Take 400 mg by mouth at bedtime.    [provider]    Family History Family History  Problem Relation Age of Onset  . Diabetes Father   . Colon polyps Brother   . Heart disease Maternal Aunt     Social History Social History  Substance Use Topics  . Smoking status: Current Every Day Smoker    Packs/day: 1.50    Types: Cigarettes  . Smokeless tobacco: Never Used  . Alcohol use No     Allergies   Penicillins and Tylenol [acetaminophen]   Review of Systems Review of Systems  Constitutional: Negative for chills and fever.  HENT: Negative for trouble swallowing.   Respiratory: Negative for shortness of breath.   Cardiovascular: Negative for chest pain.  Gastrointestinal: Negative for nausea and vomiting.  Skin: Positive for rash.     Physical Exam Updated Vital  Signs BP (!) 165/109 (BP Location: Left Arm)   Pulse 90   Temp 99 F (37.2 C) (Oral)   Resp 18   SpO2 98%   Physical Exam  Constitutional: He appears well-developed and well-nourished.  Sitting comfortably on examination table  HENT:  Head: Normocephalic and atraumatic.  Mouth/Throat: Uvula is midline, oropharynx is clear and moist and mucous membranes are normal.  No oral angioedema.  Eyes: Conjunctivae and EOM are normal. Pupils are equal, round, and reactive to light. Right eye exhibits no discharge. Left eye exhibits no discharge. No scleral icterus.  Cardiovascular:  Normal rate, regular rhythm, normal heart sounds and intact distal pulses.   Pulmonary/Chest: Effort normal and breath sounds normal. No respiratory distress. He has no wheezes. He has no rales.  No evidence of respiratory distress. Able to speak in full sentences without difficulty.  Neurological: He is alert.  Skin: Skin is warm and dry. Capillary refill takes less than 2 seconds. Rash noted.  Multiple diffusely scattered urticaria to right axilla that extends down to the right side of his abdomen. Erythema and induration to the right lateral chest wall. No surrounding warmth or tenderness. No rash of palms or so and feet.  Psychiatric: He has a normal mood and affect. His speech is normal and behavior is normal.  Nursing note and vitals reviewed.    ED Treatments / Results  DIAGNOSTIC STUDIES: Oxygen Saturation is 98% on RA, normal by my interpretation.   COORDINATION OF CARE: 10:37 AM- Pt verbalizes understanding and agrees to plan.  Medications - No data to display  Labs (all labs ordered are listed, but only abnormal results are displayed) Labs Reviewed - No data to display  EKG  EKG Interpretation None       Radiology No results found.  Procedures Procedures (including critical care time)  Medications Ordered in ED Medications - No data to display   Initial Impression / Assessment and Plan / ED Course  I have reviewed the triage vital signs and the nursing notes.  Pertinent labs & imaging results that were available during my care of the patient were reviewed by me and considered in my medical decision making (see chart for details).     41 y.o. male who presents with rash to right axilla and right side that began yesterday. No treatments tried prior to arrival. Otherwise no other associated symptoms. Patient is afebrile, non-toxic appearing, sitting comfortably on examination table. Vital signs reviewed. Patient is hypertensive. He is on lisinopril for  hypertension but states that he has not taken it this morning as he was trying to come to the emergency department. Otherwise no acute distress. No evidence of respiratory symptoms. Physical exam shows diffuse urticaria that appears to be consistent with contact dermatitis. Doubt cellulitis. Patient has no other symptoms. We'll plan to treat as allergic reaction. Will treat symptomatically. Provided patient with a list of clinic resources to use if he does not have a PCP. Instructed to call them today to arrange follow-up in the next 24-48 hours.  Patient instructed to take his blood pressure medications when going home. Strict return precautions discussed. Patient expresses understanding and agreement to plan.   Final Clinical Impressions(s) / ED Diagnoses   Final diagnoses:  Rash  Allergic reaction, initial encounter    New Prescriptions Discharge Medication List as of 02/21/2017 11:03 AM    START taking these medications   Details  diphenhydrAMINE (BENADRYL) 25 mg capsule Take 1 capsule (25 mg total) by  mouth every 6 (six) hours as needed., Starting Sun 02/21/2017, Print    hydrocortisone cream 1 % Apply to affected area 2 times daily, Print        I personally performed the services described in this documentation, which was scribed in my presence. The recorded information has been reviewed and is accurate.     Maxwell CaulLayden, Doni Bacha A, PA-C 02/21/17 1112    Doug SouJacubowitz, Sam, MD 02/21/17 1757

## 2017-02-21 NOTE — ED Provider Notes (Addendum)
Patient with pruritic nonpainful rash over right axilla and right chest since yesterday. No fever. No shortness of breath No other associated symptoms. He does not feel ill. No treatment prior to coming here. On exam alert nontoxic. He has a hive-like rash over right axilla and right chest which is nontender. He appears in no distress. He did not take his lisinopril this morning as per his usual schedule. He is advised to take his blood pressure medication upon arrival home today   Doug SouJacubowitz, Tyleigh Mahn, MD 02/21/17 1050    Doug SouJacubowitz, Breyona Swander, MD 02/21/17 1050

## 2017-02-21 NOTE — ED Triage Notes (Signed)
Pt reports having rash and itching to right arm and side since yesterday. Has not taken any benadryl pta.

## 2017-02-21 NOTE — Discharge Instructions (Signed)
Take Benadryl as directed.  Use the hydrocortisone cream to the affected area.  Follow-up with your primary care doctor or follow-up with the wellness Center listed above if symptoms worsen.  Return the emergency Department for any worsening rash, difficulty swallowing, difficulty breathing, chest pain or any other worsening or concerning symptoms.

## 2017-05-06 ENCOUNTER — Emergency Department (HOSPITAL_COMMUNITY)
Admission: EM | Admit: 2017-05-06 | Discharge: 2017-05-07 | Disposition: A | Discharge status: Home / Self Care | Payer: Medicaid Other | Attending: Emergency Medicine | Admitting: Emergency Medicine

## 2017-05-06 ENCOUNTER — Emergency Department (HOSPITAL_COMMUNITY): Payer: Medicaid Other

## 2017-05-06 ENCOUNTER — Encounter (HOSPITAL_COMMUNITY): Payer: Self-pay

## 2017-05-06 DIAGNOSIS — F419 Anxiety disorder, unspecified: Secondary | ICD-10-CM | POA: Insufficient documentation

## 2017-05-06 DIAGNOSIS — R51 Headache: Secondary | ICD-10-CM | POA: Insufficient documentation

## 2017-05-06 DIAGNOSIS — F319 Bipolar disorder, unspecified: Secondary | ICD-10-CM | POA: Insufficient documentation

## 2017-05-06 DIAGNOSIS — Z79899 Other long term (current) drug therapy: Secondary | ICD-10-CM | POA: Diagnosis not present

## 2017-05-06 DIAGNOSIS — I10 Essential (primary) hypertension: Secondary | ICD-10-CM | POA: Insufficient documentation

## 2017-05-06 DIAGNOSIS — R519 Headache, unspecified: Secondary | ICD-10-CM

## 2017-05-06 DIAGNOSIS — F17210 Nicotine dependence, cigarettes, uncomplicated: Secondary | ICD-10-CM | POA: Insufficient documentation

## 2017-05-06 DIAGNOSIS — F209 Schizophrenia, unspecified: Secondary | ICD-10-CM | POA: Diagnosis not present

## 2017-05-06 HISTORY — DX: Post-traumatic stress disorder, unspecified: F43.10

## 2017-05-06 HISTORY — DX: Bipolar disorder, unspecified: F31.9

## 2017-05-06 NOTE — ED Triage Notes (Signed)
Pt from home via EMS with sudden onset of sharp frontal HA x 45 mins. Pt reports he believes his HA is caused by his psychiatric problems which includes bipolar disorder, schizophrenia and PTSD. Pt states "this is what happened last time, and I ended up in the psych ward." Pt denies SI/HI, per family pt has history and potential to become aggressive and combative. Pt calm and cooperative at this time. EMS VS 172/119, 85 HR, 20 RR, 95% on RA. 18 G L AC. Pt ambulatory. Denies N/V, blurred vision, dizziness. No deficits per EMS.

## 2017-05-06 NOTE — ED Notes (Signed)
Patient transported to CT 

## 2017-05-07 MED ORDER — METOCLOPRAMIDE HCL 5 MG/ML IJ SOLN
10.0000 mg | Freq: Once | INTRAMUSCULAR | Status: AC
  Administered 2017-05-07: 10 mg via INTRAVENOUS
  Filled 2017-05-07: qty 2

## 2017-05-07 MED ORDER — DIPHENHYDRAMINE HCL 50 MG/ML IJ SOLN
25.0000 mg | Freq: Once | INTRAMUSCULAR | Status: AC
  Administered 2017-05-07: 25 mg via INTRAVENOUS
  Filled 2017-05-07: qty 1

## 2017-05-07 MED ORDER — KETOROLAC TROMETHAMINE 30 MG/ML IJ SOLN
30.0000 mg | Freq: Once | INTRAMUSCULAR | Status: AC
  Administered 2017-05-07: 30 mg via INTRAVENOUS
  Filled 2017-05-07: qty 1

## 2017-05-07 NOTE — ED Notes (Signed)
Pt departed in NAD, refused use of wheelchair.  

## 2017-05-07 NOTE — ED Provider Notes (Signed)
[facility] Provider Note   CSN: [id number] Arrival date & time: 05/06/17  2326     History   Chief Complaint Chief Complaint  Patient presents with  . Hypertension  . Headache    HPI Cristian Anderson is a 41 y.o. male.  Patient with history of bipolar disorder and schizophrenia on Seroquel, HTN on lisinopril -- presents with acute onset of severe headache starting approximately 45 minutes prior to arrival. Patient had sudden abrupt onset of severe headache without injury or exertion. EMS called, found to be hypertensive. Patient denies signs of stroke including: facial droop, slurred speech, aphasia, weakness/numbness in extremities, imbalance/trouble walking. No neck pain or pain with movement of the neck. No vision change or loss, vomiting or abdominal pains. No chest pains or shortness of breath. He appears to be stable from a psychiatric standpoint.       Past Medical History:  Diagnosis Date  . Anxiety attack   . Bipolar 1 disorder (HCC)   . Fatty liver   . GERD (gastroesophageal reflux disease)   . Hypertension   . Obesity   . Panic attack   . PTSD (post-traumatic stress disorder)   . Schizophrenia (HCC)     There are no active problems to display for this patient.   History reviewed. No pertinent surgical history.     Home Medications    Prior to Admission medications   Medication Sig Start Date End Date Taking? Authorizing Provider  ALPRAZolam Prudy Feeler) 0.5 MG tablet Take 0.5 mg by mouth at bedtime.    [provider]  diphenhydrAMINE (BENADRYL) 25 mg capsule Take 1 capsule (25 mg total) by mouth every 6 (six) hours as needed. 02/21/17   Maxwell Caul, PA-C  FLUoxetine (PROZAC) 20 MG capsule Take 20 mg by mouth 4 (four) times daily.    [provider]  hydrocortisone cream 1 % Apply to affected area 2 times daily 02/21/17   Graciella Freer A, PA-C  lisinopril (PRINIVIL,ZESTRIL) 40 MG tablet Take 40 mg by mouth at bedtime.    [provider]  polyethylene glycol (MIRALAX) packet Take 17 g by mouth daily. 09/14/16   Cheri Fowler, PA-C  QUEtiapine (SEROQUEL) 400 MG tablet Take 400 mg by mouth at bedtime.    [provider]    Family History Family History  Problem Relation Age of Onset  . Diabetes Father   . Colon polyps Brother   . Heart disease Maternal Aunt     Social History Social History  Substance Use Topics  . Smoking status: Current Every Day Smoker    Packs/day: 1.50    Types: Cigarettes  . Smokeless tobacco: Never Used  . Alcohol use No     Allergies   Penicillins and Tylenol [acetaminophen]   Review of Systems Review of Systems  Constitutional: Negative for fever.  HENT: Negative for congestion, dental problem, rhinorrhea and sinus pressure.   Eyes: Negative for photophobia, discharge, redness and visual disturbance.  Respiratory: Negative for shortness of breath.   Cardiovascular: Negative for chest pain.  Gastrointestinal: Negative for nausea and vomiting.  Musculoskeletal: Negative for gait problem, neck pain and neck stiffness.  Skin: Negative for rash.  Neurological: Positive for headaches. Negative for syncope, speech difficulty, weakness, light-headedness and numbness.  Psychiatric/Behavioral: Negative for confusion.     Physical Exam Updated Vital Signs BP (!) 183/122 (BP Location: Left Arm)   Pulse 77   Temp 99 F (37.2 C) (Oral)   Resp 16  Ht  (1.702 m)   Wt 125.2 kg (276 lb)   SpO2 95%   BMI 43.23 kg/m   Physical Exam  Constitutional: He is oriented to person, place, and time. He appears well-developed and well-nourished.  HENT:  Head: Normocephalic and atraumatic.  Right Ear: Tympanic membrane, external ear and ear canal normal.  Left Ear: Tympanic membrane, external ear and ear canal normal.  Nose: Nose normal.  Mouth/Throat: Uvula is midline, oropharynx is clear and moist and mucous membranes are normal.  Eyes: Pupils are equal, round, and  reactive to light. Conjunctivae, EOM and lids are normal.  Neck: Normal range of motion. Neck supple.  Cardiovascular: Normal rate and regular rhythm.   Pulmonary/Chest: Effort normal and breath sounds normal.  Abdominal: Soft. There is no tenderness.  Musculoskeletal: Normal range of motion.       Cervical back: He exhibits normal range of motion, no tenderness and no bony tenderness.  Neurological: He is alert and oriented to person, place, and time. He has normal strength and normal reflexes. No cranial nerve deficit or sensory deficit. He exhibits normal muscle tone. He displays a negative Romberg sign. Coordination and gait normal. GCS eye subscore is 4. GCS verbal subscore is 5. GCS motor subscore is 6.  Skin: Skin is warm and dry.  Psychiatric: He has a normal mood and affect.  Nursing note and vitals reviewed.    ED Treatments / Results  Labs (all labs ordered are listed, but only abnormal results are displayed) Labs Reviewed - No data to display  EKG  EKG Interpretation None       Radiology Ct Head Wo Contrast  Result Date: 05/07/2017 CLINICAL DATA:  Sudden onset of headache. Severe, worst headache of life. EXAM: CT HEAD WITHOUT CONTRAST TECHNIQUE: Contiguous axial images were obtained from the base of the skull through the vertex without intravenous contrast. COMPARISON:  None. FINDINGS: Brain: No intracranial hemorrhage, mass effect, or midline shift. No hydrocephalus. The basilar cisterns are patent. No evidence of territorial infarct or acute ischemia. No extra-axial or intracranial fluid collection. Vascular: No hyperdense vessel. Skull: Normal. Negative for fracture or focal lesion. Sinuses/Orbits: Small fluid level in left side of sphenoid sinus. Sinuses are otherwise clear. Mastoid air cells are clear. Visualized orbits are normal. Other: None. IMPRESSION: 1.  No acute intracranial abnormality. 2. Minimal left sphenoid sinus inflammation. Electronically Signed   By:  Rubye Oaks M.D.   On: 05/07/2017 00:05    Procedures Procedures (including critical care time)  Medications Ordered in ED Medications  metoCLOPramide (REGLAN) injection 10 mg (10 mg Intravenous Given 05/07/17 0049)  diphenhydrAMINE (BENADRYL) injection 25 mg (25 mg Intravenous Given 05/07/17 0048)  ketorolac (TORADOL) 30 MG/ML injection 30 mg (30 mg Intravenous Given 05/07/17 0049)     Initial Impression / Assessment and Plan / ED Course  I have reviewed the triage vital signs and the nursing notes.  Pertinent labs & imaging results that were available during my care of the patient were reviewed by me and considered in my medical decision making (see chart for details).     Patient seen and examined. CT ordered on arrival to eval for bleeding.   Vital signs reviewed and are as follows: BP (!) 183/122 (BP Location: Left Arm)   Pulse 77   Temp 99 F (37.2 C) (Oral)   Resp 16   Ht  (1.702 m)   Wt 125.2 kg (276 lb)   SpO2 95%   BMI 43.23  kg/m   12:28 AM CT reviewed by myself. It shows no evidence of bleeding. Pt updated. Will treat patient with IV medications and reassess.  12:52 AM Handoff to Dansie PA-C at shift change.   Plan: d/c to home when symptoms improved.   Final Clinical Impressions(s) / ED Diagnoses   Final diagnoses:  Bad headache   Pt with acute onset HA, negative head CT < 2 hrs after symptoms onset. Patient without other high-risk features of headache including: altered mental status, accompanying seizure, headache with exertion, age > 28, history of immunocompromise, neck or shoulder pain, fever, use of anticoagulation, family history of spontaneous SAH, concomitant drug use, toxic exposure.   Patient has a normal complete neurological exam, normal vital signs, normal level of consciousness, no signs of meningismus, is well-appearing/non-toxic appearing, no signs of trauma.  No dangerous or life-threatening conditions suspected or identified by  history, physical exam, and by work-up. No indications for hospitalization identified.    New Prescriptions New Prescriptions   No medications on file     Renne Crigler, Cordelia Poche 05/07/17 9604    Zadie Rhine, MD 05/07/17 [phone]

## 2017-05-07 NOTE — Discharge Instructions (Signed)
Please read and follow all provided instructions.  Your diagnoses today include:  1. Bad headache     Tests performed today include:  CT of your head which was normal and did not show any serious cause of your headache  Vital signs. See below for your results today.   Medications:  In the Emergency Department you received:  Reglan - antinausea/headache medication  Benadryl - antihistamine to counteract potential side effects of reglan  Toradol - NSAID medication similar to ibuprofen  Take any prescribed medications only as directed.  Additional information:  Follow any educational materials contained in this packet.  You are having a headache. No specific cause was found today for your headache. It may have been a migraine or other cause of headache. Stress, anxiety, fatigue, and depression are common triggers for headaches.   Your headache today does not appear to be life-threatening or require hospitalization, but often the exact cause of headaches is not determined in the emergency department. Therefore, follow-up with your doctor is very important to find out what may have caused your headache and whether or not you need any further diagnostic testing or treatment.   Sometimes headaches can appear benign (not harmful), but then more serious symptoms can develop which should prompt an immediate re-evaluation by your doctor or the emergency department.  BE VERY CAREFUL not to take multiple medicines containing Tylenol (also called acetaminophen). Doing so can lead to an overdose which can damage your liver and cause liver failure and possibly death.   Follow-up instructions: Please follow-up with your primary care provider in the next 3 days for further evaluation of your symptoms.   Return instructions:   Please return to the Emergency Department if you experience worsening symptoms.  Return if the medications do not resolve your headache, if it recurs, or if you have  multiple episodes of vomiting or cannot keep down fluids.  Return if you have a change from the usual headache.  RETURN IMMEDIATELY IF you:  Develop a sudden, severe headache  Develop confusion or become poorly responsive or faint  Develop a fever above 100.8F or problem breathing  Have a change in speech, vision, swallowing, or understanding  Develop new weakness, numbness, tingling, incoordination in your arms or legs  Have a seizure  Please return if you have any other emergent concerns.  Additional Information:  Your vital signs today were: BP (!) 183/122 (BP Location: Left Arm)    Pulse 77    Temp 99 F (37.2 C) (Oral)    Resp 16    Ht  (1.702 m)    Wt 125.2 kg (276 lb)    SpO2 95%    BMI 43.23 kg/m  If your blood pressure (BP) was elevated above 135/85 this visit, please have this repeated by your doctor within one month. --------------

## 2017-05-07 NOTE — ED Provider Notes (Signed)
Patient's care assumed from [facility] [place] [place], PA-C at shift change. Please see his note for further.  Briefly, the patient presented with headache starting this evening. His CT scan was unremarkable. Plantar changes for the patient received a headache cocktail and discharge if patient is headache is improved at reevaluation. At my evaluation patient reports his headache has completely resolved. We'll discharge with follow-up with primary care. Return precautions discussed. I advised the patient to follow-up with their primary care provider this week. I advised the patient to return to the emergency department with new or worsening symptoms or new concerns. The patient verbalized understanding and agreement with plan.   Bad headache  Hypertension, unspecified type     Everlene Farrier, PA-C 05/07/17 0401    Zadie Rhine, MD 05/07/17 [phone]

## 2017-11-27 DIAGNOSIS — E782 Mixed hyperlipidemia: Secondary | ICD-10-CM | POA: Insufficient documentation

## 2017-11-27 DIAGNOSIS — I10 Essential (primary) hypertension: Secondary | ICD-10-CM | POA: Insufficient documentation

## 2017-11-27 DIAGNOSIS — G4733 Obstructive sleep apnea (adult) (pediatric): Secondary | ICD-10-CM | POA: Insufficient documentation

## 2017-11-27 DIAGNOSIS — F17290 Nicotine dependence, other tobacco product, uncomplicated: Secondary | ICD-10-CM | POA: Insufficient documentation

## 2017-12-18 DIAGNOSIS — R7303 Prediabetes: Secondary | ICD-10-CM | POA: Insufficient documentation

## 2017-12-18 DIAGNOSIS — R945 Abnormal results of liver function studies: Secondary | ICD-10-CM | POA: Insufficient documentation

## 2018-03-31 IMAGING — CT CT HEAD W/O CM
4 series · 16 of 47 positions shown, 18 images · non-contrast
Comparison: None.

CLINICAL DATA: Sudden onset of headache. Severe, worst headache of
life.

EXAM:
CT HEAD WITHOUT CONTRAST
TECHNIQUE: Contiguous axial images were obtained from the base of the skull
through the vertex without intravenous contrast.

[Series 3: head wo · axial · 0.44mm/px · z∈[+1166,+1286]mm · 7 of 34 slices shown, 9 images]
[im 5/34  brain]
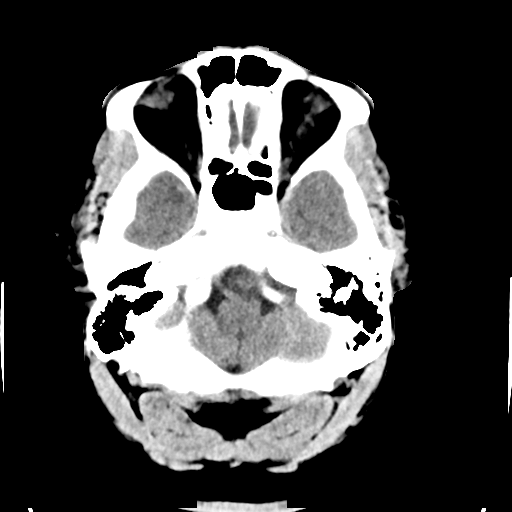
[im 5/34  bone]
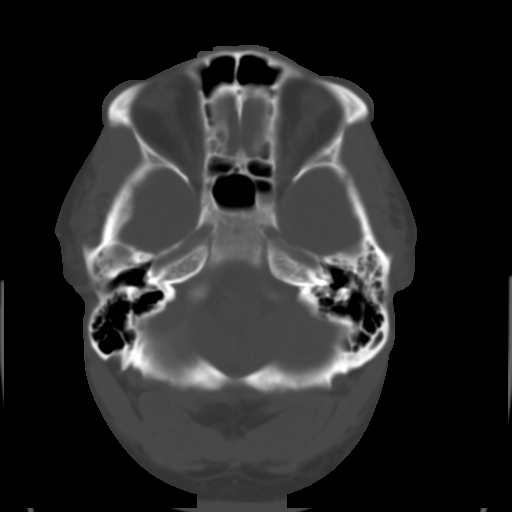
[im 9/34  brain]
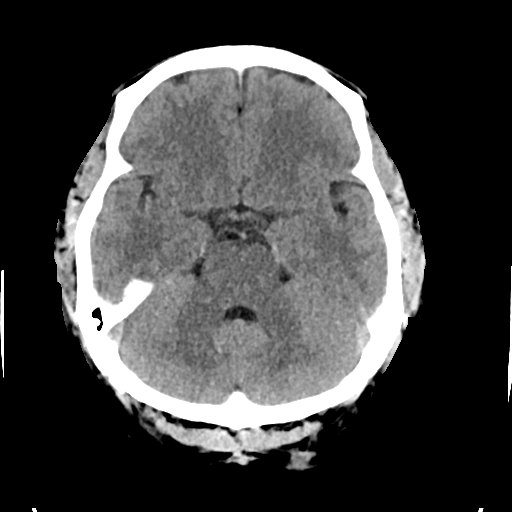
[im 13/34  brain]
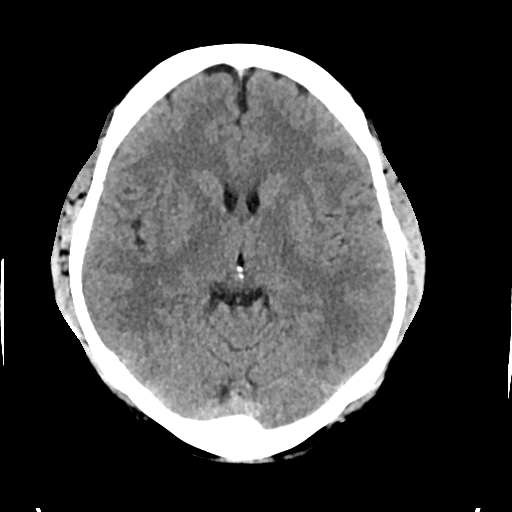
[im 17/34  brain]
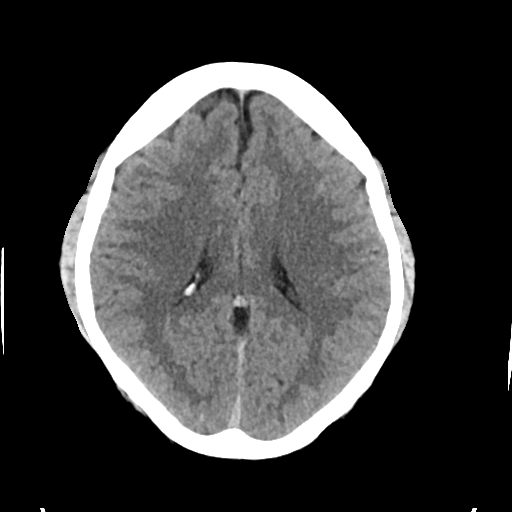
[im 21/34  brain]
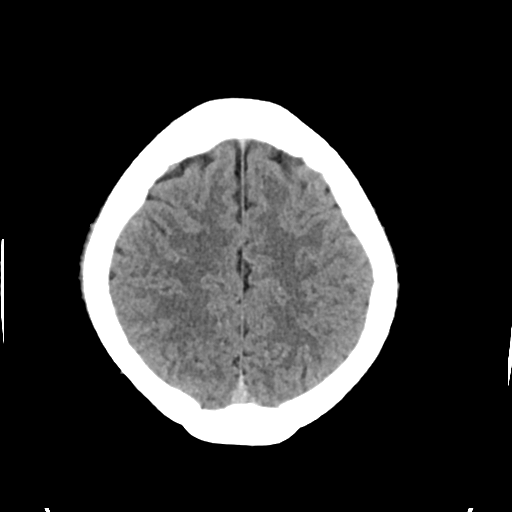
[im 21/34  bone]
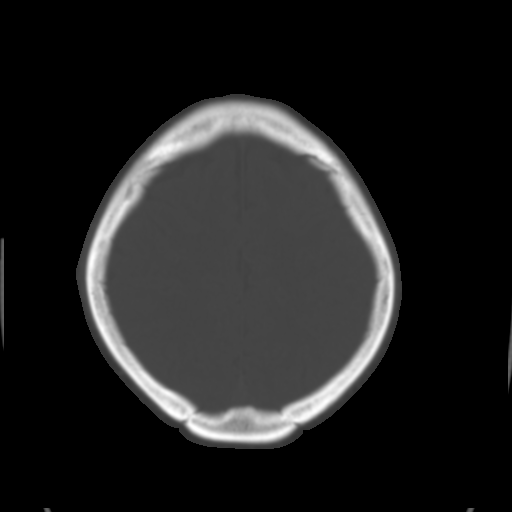
[im 25/34  brain]
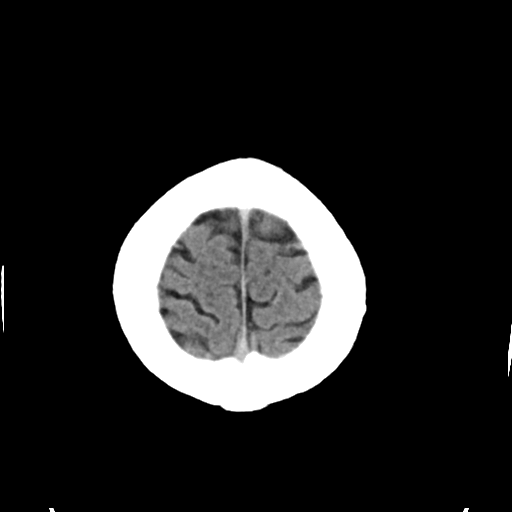
[im 29/34  brain]
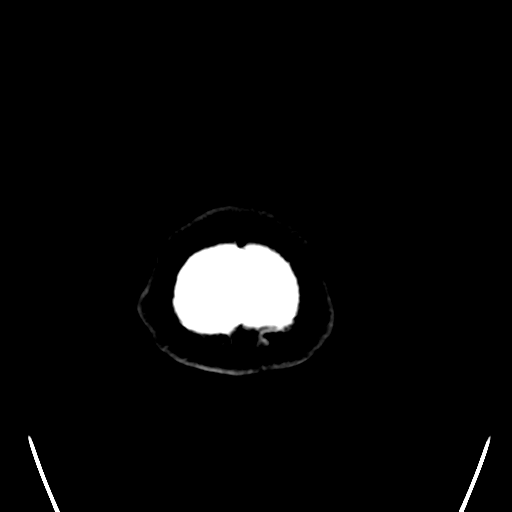

[Series 4: head bone · axial · 0.44mm/px · z∈[+1162,+1196]mm · 3 of 85 slices shown]
[im 9/85  bone]
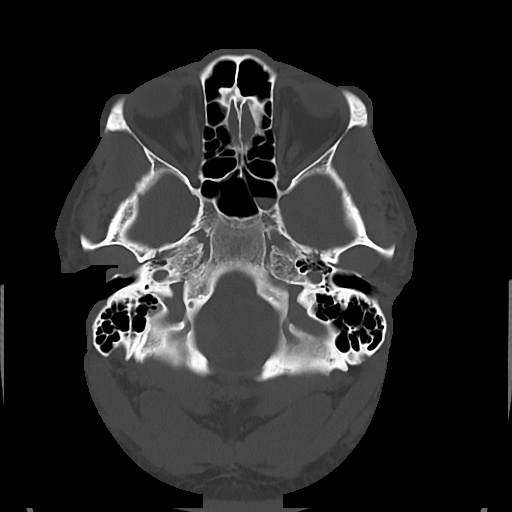
[im 17/85  bone]
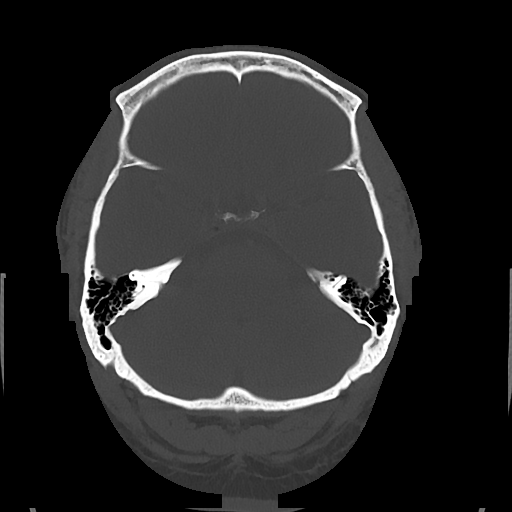
[im 26/85  bone]
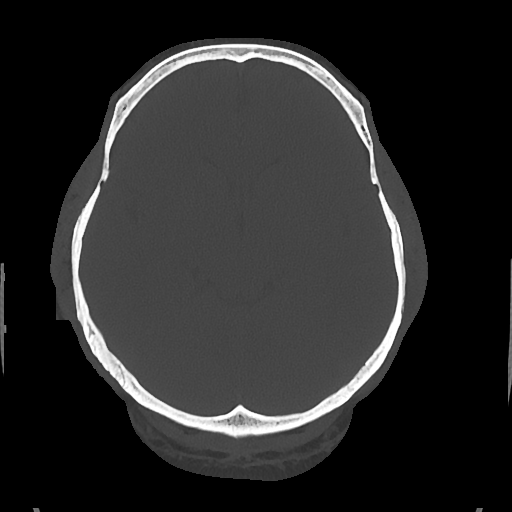

[Series 5: cor soft · coronal · 0.32mm/px · 3 of 72 slices shown]
[im 24/72  brain]
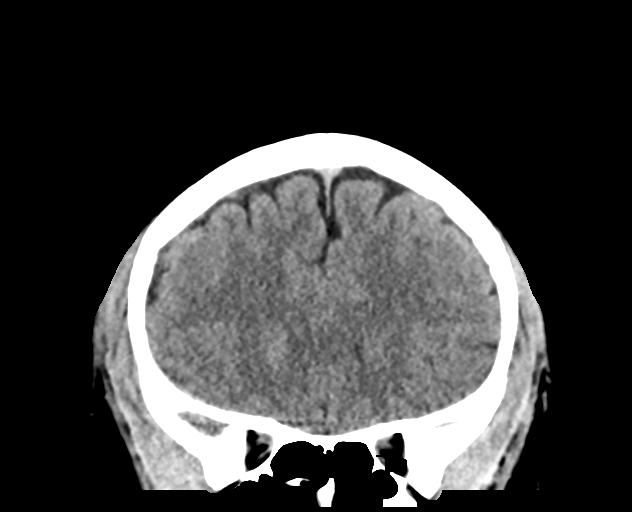
[im 32/72  brain]
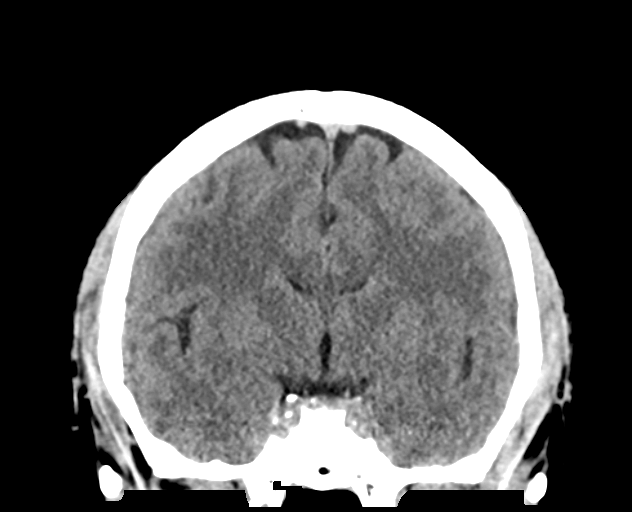
[im 40/72  brain]
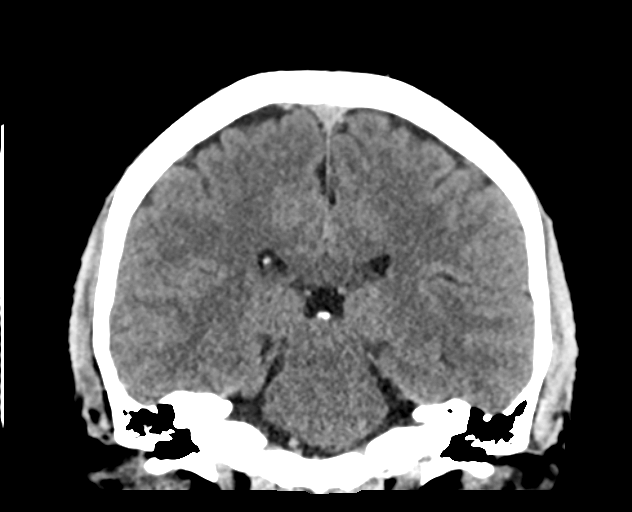

[Series 6: sag soft · sagittal · 0.32mm/px · 3 of 67 slices shown]
[im 23/67  brain]
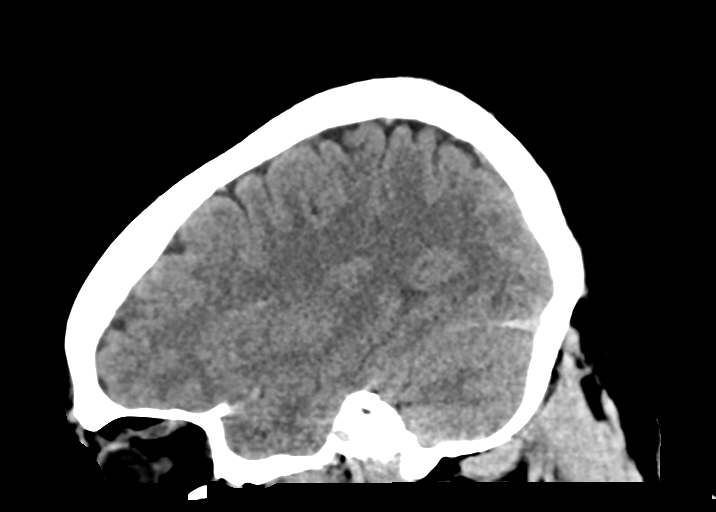
[im 34/67  brain]
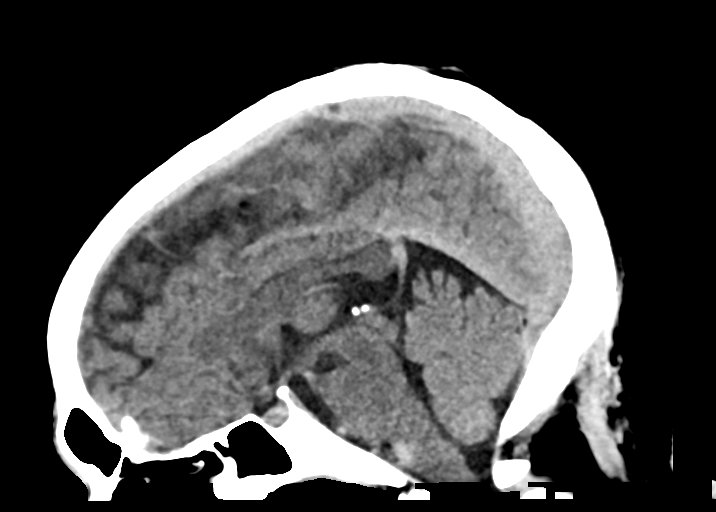
[im 45/67  brain]
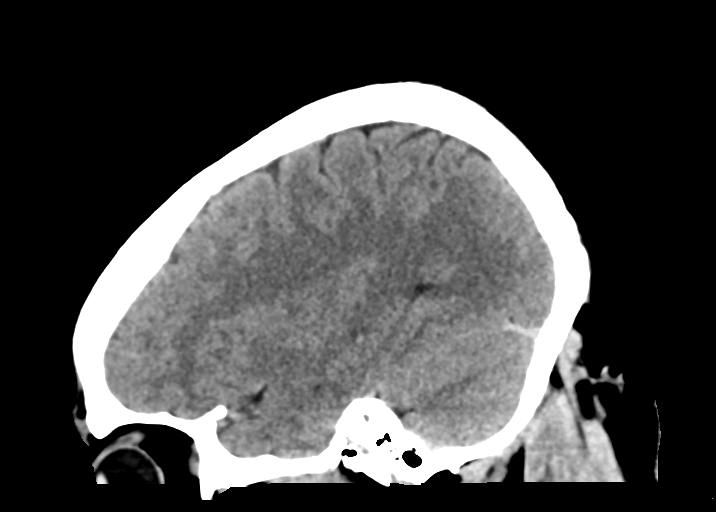

[16 of 47 positions shown; findings below may reference images not displayed]

FINDINGS: Brain: No intracranial hemorrhage, mass effect, or midline shift. No
hydrocephalus. The basilar cisterns are patent. No evidence of
territorial infarct or acute ischemia. No extra-axial or
intracranial fluid collection.

Vascular: No hyperdense vessel.

Skull: Normal. Negative for fracture or focal lesion.

Sinuses/Orbits: Small fluid level in left side of sphenoid sinus.
Sinuses are otherwise clear. Mastoid air cells are clear. Visualized
orbits are normal.

Other: None.
IMPRESSION: 1.  No acute intracranial abnormality.
2. Minimal left sphenoid sinus inflammation.

## 2018-05-04 DIAGNOSIS — Z6841 Body Mass Index (BMI) 40.0 and over, adult: Secondary | ICD-10-CM | POA: Insufficient documentation

## 2018-05-25 DIAGNOSIS — F203 Undifferentiated schizophrenia: Secondary | ICD-10-CM | POA: Insufficient documentation

## 2018-06-14 ENCOUNTER — Encounter (HOSPITAL_COMMUNITY): Payer: Self-pay | Admitting: *Deleted

## 2018-06-14 ENCOUNTER — Ambulatory Visit (HOSPITAL_COMMUNITY)
Admission: EM | Admit: 2018-06-14 | Discharge: 2018-06-14 | Disposition: A | Discharge status: Home / Self Care | Payer: Medicaid Other | Attending: Family Medicine | Admitting: Family Medicine

## 2018-06-14 ENCOUNTER — Other Ambulatory Visit: Payer: Self-pay

## 2018-06-14 DIAGNOSIS — M546 Pain in thoracic spine: Secondary | ICD-10-CM

## 2018-06-14 MED ORDER — MELOXICAM 7.5 MG PO TABS: 7.5000 mg | ORAL_TABLET | Freq: Every day | ORAL | 0 refills | Status: DC

## 2018-06-14 MED ORDER — CYCLOBENZAPRINE HCL 5 MG PO TABS: 5.0000 mg | ORAL_TABLET | Freq: Every evening | ORAL | 0 refills | Status: DC | PRN

## 2018-06-14 MED ORDER — LIDOCAINE 5 % EX PTCH: 1.0000 | MEDICATED_PATCH | CUTANEOUS | 0 refills | Status: DC

## 2018-06-14 NOTE — ED Provider Notes (Signed)
[facility]    CSN: [id number] Arrival date & time: 06/14/18  1120     History   Chief Complaint Chief Complaint  Patient presents with  . Shoulder Pain    HPI Tywone Bembenek is a 42 y.o. male.   42 year old male comes in for 2-3 day history of right thoracic back pain. Denies injury/trauma. Pain worse with movement, and can sometimes radiate to the shoulder/right upper arm. Denies numbness/tingling. Denies loss of grip strength, dexterity. Has been taking ibuprofen, warm compress without relief.      Past Medical History:  Diagnosis Date  . Anxiety attack   . Bipolar 1 disorder (HCC)   . Fatty liver   . GERD (gastroesophageal reflux disease)   . Hypertension   . Obesity   . Panic attack   . PTSD (post-traumatic stress disorder)   . Schizophrenia (HCC)     There are no active problems to display for this patient.   History reviewed. No pertinent surgical history.     Home Medications    Prior to Admission medications   Medication Sig Start Date End Date Taking? Authorizing Provider  ALPRAZolam Prudy Feeler) 0.5 MG tablet Take 0.5 mg by mouth at bedtime.    [provider]  cyclobenzaprine (FLEXERIL) 5 MG tablet Take 1 tablet (5 mg total) by mouth at bedtime as needed for muscle spasms. 06/14/18   Cathie Hoops, Alaiah Lundy V, PA-C  diphenhydrAMINE (BENADRYL) 25 mg capsule Take 1 capsule (25 mg total) by mouth every 6 (six) hours as needed. 02/21/17   Maxwell Caul, PA-C  FLUoxetine (PROZAC) 20 MG capsule Take 20 mg by mouth 4 (four) times daily.    [provider]  hydrocortisone cream 1 % Apply to affected area 2 times daily 02/21/17   Graciella Freer A, PA-C  lidocaine (LIDODERM) 5 % Place 1 patch onto the skin daily. Remove & Discard patch within 12 hours or as directed by MD 06/14/18   Cathie Hoops, Amatullah Christy V, PA-C  lisinopril (PRINIVIL,ZESTRIL) 40 MG tablet Take 40 mg by mouth at bedtime.    [provider]  meloxicam (MOBIC) 7.5 MG tablet Take 1 tablet (7.5  mg total) by mouth daily. 06/14/18   Cathie Hoops, Lyam Provencio V, PA-C  polyethylene glycol (MIRALAX) packet Take 17 g by mouth daily. 09/14/16   Cheri Fowler, PA-C  QUEtiapine (SEROQUEL) 400 MG tablet Take 400 mg by mouth at bedtime.    [provider]    Family History Family History  Problem Relation Age of Onset  . Diabetes Father   . Colon polyps Brother   . Heart disease Maternal Aunt     Social History Social History   Tobacco Use  . Smoking status: Current Every Day Smoker    Packs/day: 1.50    Types: Cigarettes  . Smokeless tobacco: Never Used  Substance Use Topics  . Alcohol use: No  . Drug use: No     Allergies   Penicillins and Tylenol [acetaminophen]   Review of Systems Review of Systems  Reason unable to perform ROS: See HPI as above.     Physical Exam Triage Vital Signs ED Triage Vitals  Enc Vitals Group     BP 06/14/18 1146 (!) 156/93     Pulse Rate 06/14/18 1146 73     Resp 06/14/18 1146 18     Temp 06/14/18 1146 99.1 F (37.3 C)     Temp Source 06/14/18 1146 Oral     SpO2 06/14/18 1146 99 %  Weight --      Height --      Head Circumference --      Peak Flow --      Pain Score 06/14/18 1147 9     Pain Loc --      Pain Edu? --      Excl. in GC? --    No data found.  Updated Vital Signs BP (!) 156/93 (BP Location: Right Arm)   Pulse 73   Temp 99.1 F (37.3 C) (Oral)   Resp 18   SpO2 99%   Physical Exam  Constitutional: He is oriented to person, place, and time. He appears well-developed and well-nourished. No distress.  HENT:  Head: Normocephalic and atraumatic.  Eyes: Pupils are equal, round, and reactive to light. Conjunctivae are normal.  Neck: Normal range of motion. Neck supple. No spinous process tenderness and no muscular tenderness present. Normal range of motion present.  Cardiovascular: Normal rate, regular rhythm and normal heart sounds. Exam reveals no gallop and no friction rub.  No murmur heard. Pulmonary/Chest: Effort  normal and breath sounds normal. No accessory muscle usage or stridor. No respiratory distress. He has no decreased breath sounds. He has no wheezes. He has no rhonchi. He has no rales.  Musculoskeletal:  No tenderness on palpation of the spinous processes. Tenderness to palpation of right thoracic back. No bony tenderness to the shoulder. Decrease abduction of the right shoulder due to back pain. Strength deferred of shoulder. Elbow strength normal and equal bilaterally. Sensation intact and equal bilaterally.  Radial pulses 2+ and equal bilaterally. Capillary refill less than 2 seconds.   Neurological: He is alert and oriented to person, place, and time.  Skin: Skin is warm and dry. He is not diaphoretic.     UC Treatments / Results  Labs (all labs ordered are listed, but only abnormal results are displayed) Labs Reviewed - No data to display  EKG None  Radiology No results found.  Procedures Procedures (including critical care time)  Medications Ordered in UC Medications - No data to display  Initial Impression / Assessment and Plan / UC Course  I have reviewed the triage vital signs and the nursing notes.  Pertinent labs & imaging results that were available during my care of the patient were reviewed by me and considered in my medical decision making (see chart for details).    Start NSAID as directed for pain and inflammation. Muscle relaxant as needed. Ice/heat compresses. Discussed with patient strain can take up to 3-4 weeks to resolve, but should be getting better each week. Return precautions given.   Final Clinical Impressions(s) / UC Diagnoses   Final diagnoses:  Acute right-sided thoracic back pain    ED Prescriptions    Medication Sig Dispense Auth. Provider   meloxicam (MOBIC) 7.5 MG tablet Take 1 tablet (7.5 mg total) by mouth daily. 15 tablet Chanti Golubski V, PA-C   cyclobenzaprine (FLEXERIL) 5 MG tablet Take 1 tablet (5 mg total) by mouth at bedtime as needed  for muscle spasms. 10 tablet Jozlynn Plaia V, PA-C   lidocaine (LIDODERM) 5 % Place 1 patch onto the skin daily. Remove & Discard patch within 12 hours or as directed by MD 30 patch Threasa Alpha, PA-C 06/14/18 1245

## 2018-06-14 NOTE — Discharge Instructions (Signed)
Start Mobic. Do not take ibuprofen (motrin/advil)/ naproxen (aleve) while on mobic. Lidoderm patches as needed for additional pain relief. Flexeril as needed at night. Flexeril can make you drowsy, so do not take if you are going to drive, operate heavy machinery, or make important decisions. Ice/heat compresses as needed. This can take up to 3-4 weeks to completely resolve, but you should be feeling better each week. Follow up here or with PCP if symptoms worsen, changes for reevaluation.

## 2018-06-14 NOTE — ED Triage Notes (Signed)
C/o pain right shoulder pain , states he woke up Sunday am with pain , denies injury. Using OTC medications without relief.

## 2019-02-13 ENCOUNTER — Emergency Department (HOSPITAL_COMMUNITY)
Admission: EM | Admit: 2019-02-13 | Discharge: 2019-02-14 | Discharge status: Against Medical Advice | Payer: Medicaid Other

## 2019-02-13 NOTE — ED Triage Notes (Signed)
Pt. Stated, He was going to move his car would be back.

## 2019-06-27 DIAGNOSIS — Z8719 Personal history of other diseases of the digestive system: Secondary | ICD-10-CM | POA: Insufficient documentation

## 2020-01-11 DIAGNOSIS — F17210 Nicotine dependence, cigarettes, uncomplicated: Secondary | ICD-10-CM | POA: Insufficient documentation

## 2022-01-25 NOTE — Progress Notes (Incomplete)
New Patient Office Visit  Subjective    Patient ID: Cristian Anderson, male    DOB: October 11, 1975  Age: 46 y.o. MRN: [id number]  CC: No chief complaint on file.   HPI Cristian Anderson presents to establish care Not seen since 2021 at [facility] Assessment and Plan:   Cristian Anderson was seen today for follow-up, hypertension, hyperlipidemia and prediabetes.  Diagnoses and all orders for this visit:  Mixed hyperlipidemia Comments: Improved but not at goal. The 10-year ASCVD risk score Denman George DC Montez Hageman., et al., 2013) is: 14.5%. Tolerating Rosuvastatin well, increase to 40 mg daily. Orders: - rosuvastatin (CRESTOR) 40 MG tablet; Take 1 tablet (40 mg total) by mouth daily.  Essential hypertension Comments: Elevated today. Improved but not at goal with current Amlodipine, Lisinopril, and chlorthalidone. Goal BP reinforced, pt understands and agrees. Orders: - lisinopriL (PRINIVIL,ZESTRIL) 40 MG tablet; Take 1 tablet (40 mg total) by mouth daily. - chlorthalidone (HYGROTON) 25 MG tablet; Take 1 tablet (25 mg total) by mouth daily. - amLODIPine (NORVASC) 10 MG tablet; Take 1 tablet (10 mg total) by mouth daily.   09/2020 sleep med OV Assessment/Plan:   1. OSA (obstructive sleep apnea)  2. Cigarette smoker   #1 obstructive sleep apnea ResMed download compliance report shows no data usage over the past 30 days. Mr. Cheese reports he has received the new CPAP machine and replacement supplies and has been using his CPAP machine regularly. We will contact [employer] for clarification and notify him  2. Cigarette smoker Smoking cessation counseling conducted for 5 minutes today  Outpatient Encounter Medications as of 01/26/2022  Medication Sig  . ALPRAZolam (XANAX) 0.5 MG tablet Take 0.5 mg by mouth at bedtime.  . cyclobenzaprine (FLEXERIL) 5 MG tablet Take 1 tablet (5 mg total) by mouth at bedtime as needed for muscle spasms.  . diphenhydrAMINE (BENADRYL) 25 mg capsule Take 1 capsule (25 mg total) by mouth every 6  (six) hours as needed.  Marland Kitchen FLUoxetine (PROZAC) 20 MG capsule Take 20 mg by mouth 4 (four) times daily.  . hydrocortisone cream 1 % Apply to affected area 2 times daily  . lidocaine (LIDODERM) 5 % Place 1 patch onto the skin daily. Remove & Discard patch within 12 hours or as directed by MD  . lisinopril (PRINIVIL,ZESTRIL) 40 MG tablet Take 40 mg by mouth at bedtime.  . meloxicam (MOBIC) 7.5 MG tablet Take 1 tablet (7.5 mg total) by mouth daily.  . polyethylene glycol (MIRALAX) packet Take 17 g by mouth daily.  . QUEtiapine (SEROQUEL) 400 MG tablet Take 400 mg by mouth at bedtime.   No facility-administered encounter medications on file as of 01/26/2022.    Past Medical History:  Diagnosis Date  . Anxiety attack   . Bipolar 1 disorder (HCC)   . Fatty liver   . GERD (gastroesophageal reflux disease)   . Hypertension   . Obesity   . Panic attack   . PTSD (post-traumatic stress disorder)   . Schizophrenia (HCC)     No past surgical history on file.  Family History  Problem Relation Age of Onset  . Diabetes Father   . Colon polyps Brother   . Heart disease Maternal Aunt     Social History   Socioeconomic History  . Marital status: Single    Spouse name: Not on file  . Number of children: 4  . Years of education: Not on file  . Highest education level: Not on file  Occupational History  . Occupation:  disabled  Tobacco Use  . Smoking status: Every Day    Packs/day: 1.50    Types: Cigarettes  . Smokeless tobacco: Never  Substance and Sexual Activity  . Alcohol use: No  . Drug use: No  . Sexual activity: Not on file  Other Topics Concern  . Not on file  Social History Narrative  . Not on file   Social Determinants of Health   Financial Resource Strain: Not on file  Food Insecurity: Not on file  Transportation Needs: Not on file  Physical Activity: Not on file  Stress: Not on file  Social Connections: Not on file  Intimate Partner Violence: Not on file     ROS      Objective    There were no vitals taken for this visit.  Physical Exam  {Labs (Optional):23779}    Assessment & Plan:   Problem List Items Addressed This Visit   None   No follow-ups on file.   Shan Levans, MD

## 2022-01-26 ENCOUNTER — Ambulatory Visit: Payer: Medicaid Other | Admitting: Critical Care Medicine

## 2022-03-04 ENCOUNTER — Encounter: Payer: Self-pay | Admitting: Nurse Practitioner

## 2022-03-04 ENCOUNTER — Ambulatory Visit: Payer: Medicaid Other | Attending: Nurse Practitioner | Admitting: Nurse Practitioner

## 2022-03-04 VITALS — BP 177/138 | HR 81 | Temp 98.5°F | Ht 68.5 in | Wt 326.0 lb

## 2022-03-04 DIAGNOSIS — F4310 Post-traumatic stress disorder, unspecified: Secondary | ICD-10-CM | POA: Insufficient documentation

## 2022-03-04 DIAGNOSIS — Z0189 Encounter for other specified special examinations: Secondary | ICD-10-CM | POA: Insufficient documentation

## 2022-03-04 DIAGNOSIS — D649 Anemia, unspecified: Secondary | ICD-10-CM | POA: Diagnosis not present

## 2022-03-04 DIAGNOSIS — E785 Hyperlipidemia, unspecified: Secondary | ICD-10-CM | POA: Diagnosis not present

## 2022-03-04 DIAGNOSIS — Z7689 Persons encountering health services in other specified circumstances: Secondary | ICD-10-CM

## 2022-03-04 DIAGNOSIS — F319 Bipolar disorder, unspecified: Secondary | ICD-10-CM | POA: Diagnosis not present

## 2022-03-04 DIAGNOSIS — I10 Essential (primary) hypertension: Secondary | ICD-10-CM | POA: Diagnosis present

## 2022-03-04 DIAGNOSIS — R7303 Prediabetes: Secondary | ICD-10-CM

## 2022-03-04 DIAGNOSIS — Z114 Encounter for screening for human immunodeficiency virus [HIV]: Secondary | ICD-10-CM | POA: Diagnosis not present

## 2022-03-04 DIAGNOSIS — E669 Obesity, unspecified: Secondary | ICD-10-CM | POA: Diagnosis not present

## 2022-03-04 DIAGNOSIS — K760 Fatty (change of) liver, not elsewhere classified: Secondary | ICD-10-CM | POA: Insufficient documentation

## 2022-03-04 DIAGNOSIS — F419 Anxiety disorder, unspecified: Secondary | ICD-10-CM | POA: Diagnosis not present

## 2022-03-04 DIAGNOSIS — Z6841 Body Mass Index (BMI) 40.0 and over, adult: Secondary | ICD-10-CM | POA: Diagnosis not present

## 2022-03-04 DIAGNOSIS — Z7901 Long term (current) use of anticoagulants: Secondary | ICD-10-CM | POA: Insufficient documentation

## 2022-03-04 DIAGNOSIS — Z1211 Encounter for screening for malignant neoplasm of colon: Secondary | ICD-10-CM

## 2022-03-04 DIAGNOSIS — Z1159 Encounter for screening for other viral diseases: Secondary | ICD-10-CM | POA: Diagnosis not present

## 2022-03-04 DIAGNOSIS — Z79899 Other long term (current) drug therapy: Secondary | ICD-10-CM | POA: Diagnosis not present

## 2022-03-04 DIAGNOSIS — G4733 Obstructive sleep apnea (adult) (pediatric): Secondary | ICD-10-CM | POA: Diagnosis not present

## 2022-03-04 DIAGNOSIS — F209 Schizophrenia, unspecified: Secondary | ICD-10-CM | POA: Insufficient documentation

## 2022-03-04 MED ORDER — BLOOD PRESSURE MONITOR DEVI: 0 refills | Status: DC

## 2022-03-04 MED ORDER — AMLODIPINE BESYLATE 10 MG PO TABS: 10.0000 mg | ORAL_TABLET | Freq: Every day | ORAL | 1 refills | Status: DC

## 2022-03-04 MED ORDER — VALSARTAN 40 MG PO TABS: 40.0000 mg | ORAL_TABLET | Freq: Every day | ORAL | 1 refills | Status: DC

## 2022-03-04 MED ORDER — ROSUVASTATIN CALCIUM 40 MG PO TABS: 40.0000 mg | ORAL_TABLET | Freq: Every day | ORAL | 3 refills | Status: DC

## 2022-03-04 NOTE — Progress Notes (Signed)
HTN -refill

## 2022-03-04 NOTE — Progress Notes (Signed)
Assessment & Plan:  Cristian Anderson was seen today for hypertension and establish care.  Diagnoses and all orders for this visit:  Encounter to establish care  Primary hypertension -     valsartan (DIOVAN) 40 MG tablet; Take 1 tablet (40 mg total) by mouth daily. -     amLODipine (NORVASC) 10 MG tablet; Take 1 tablet (10 mg total) by mouth daily. -     CMP14+EGFR -     Blood Pressure Monitor DEVI; Please provide patient with insurance approved blood pressure monitor  Prediabetes -     Hemoglobin A1c -     CMP14+EGFR  Dyslipidemia, goal LDL below 70 -     Lipid panel -     rosuvastatin (CRESTOR) 40 MG tablet; Take 1 tablet (40 mg total) by mouth daily.  Need for hepatitis C screening test -     HCV Ab w Reflex to Quant PCR  Encounter for screening for HIV -     HIV antibody (with reflex)  Anemia, unspecified type -     CBC with Differential  Colon cancer screening -     Cologuard    Patient has been counseled on age-appropriate routine health concerns for screening and prevention. These are reviewed and up-to-date. Referrals have been placed accordingly. Immunizations are up-to-date or declined.    Subjective:   Chief Complaint  Patient presents with   Hypertension   Establish Care   Cristian Anderson 46 y.o. male presents to office today to establish care   He has a past medical history of Anxiety attack, Bipolar 1 disorder, Fatty liver, GERD, Hypertension, Obesity, Panic attack, OSA with CAP (followed by pulmonology). Tobacco use, PTSD, and Schizophrenia   Followed by psychiatry every 3 months and seeing a counselor ([facility]) whom he states will be leaving the practice soon.   HTN Has been out of blood pressure medication for quite some time. Will refill amlodipine 10 mg daily and will switch lisinopril 40 mg to valsartan 40 mg today. Blood pressure not well controlled at home.  BP Readings from Last 3 Encounters:  03/04/22 (!) 177/138   06/14/18 (!) 156/93  05/07/17 (!) 153/93     Prediabetes Last documented A1c 5.9.  He is currently not taking any oral diabetic medications.  LDL not quite at goal of less than 70 with rosuvastatin 40 mg daily.     Review of Systems  Constitutional:  Negative for fever, malaise/fatigue and weight loss.  HENT: Negative.  Negative for nosebleeds.   Eyes: Negative.  Negative for blurred vision, double vision and photophobia.  Respiratory: Negative.  Negative for cough and shortness of breath.   Cardiovascular: Negative.  Negative for chest pain, palpitations and leg swelling.  Gastrointestinal: Negative.  Negative for heartburn, nausea and vomiting.  Musculoskeletal: Negative.  Negative for myalgias.  Neurological: Negative.  Negative for dizziness, focal weakness, seizures and headaches.  Psychiatric/Behavioral: Negative.  Negative for suicidal ideas.     Past Medical History:  Diagnosis Date   Anxiety attack    Bipolar 1 disorder (HCC)    Fatty liver    GERD (gastroesophageal reflux disease)    Hypertension    Obesity    Panic attack    PTSD (post-traumatic stress disorder)    Schizophrenia (HCC)     History reviewed. No pertinent surgical history.  Family History  Problem Relation Age of Onset   Diabetes Father    Colon polyps Brother    Heart  disease Maternal Aunt     Social History Reviewed with no changes to be made today.   Outpatient Medications Prior to Visit  Medication Sig Dispense Refill   FLUoxetine (PROZAC) 20 MG capsule Take 20 mg by mouth 4 (four) times daily.     amLODipine (NORVASC) 10 MG tablet Take 10 mg by mouth daily.     lisinopril (PRINIVIL,ZESTRIL) 40 MG tablet Take 40 mg by mouth at bedtime.     rosuvastatin (CRESTOR) 40 MG tablet Take 40 mg by mouth daily.     ALPRAZolam (XANAX) 0.5 MG tablet Take 0.5 mg by mouth at bedtime. (Patient not taking: Reported on 03/04/2022)     cyclobenzaprine (FLEXERIL) 5 MG tablet Take 1 tablet (5 mg total)  by mouth at bedtime as needed for muscle spasms. 10 tablet 0   diphenhydrAMINE (BENADRYL) 25 mg capsule Take 1 capsule (25 mg total) by mouth every 6 (six) hours as needed. 30 capsule 0   hydrocortisone cream 1 % Apply to affected area 2 times daily 15 g 0   lidocaine (LIDODERM) 5 % Place 1 patch onto the skin daily. Remove & Discard patch within 12 hours or as directed by MD 30 patch 0   meloxicam (MOBIC) 7.5 MG tablet Take 1 tablet (7.5 mg total) by mouth daily. 15 tablet 0   polyethylene glycol (MIRALAX) packet Take 17 g by mouth daily. 14 each 0   QUEtiapine (SEROQUEL) 400 MG tablet Take 400 mg by mouth at bedtime.     No facility-administered medications prior to visit.    Allergies  Allergen Reactions   Penicillins Itching   Tylenol [Acetaminophen] Itching       Objective:    BP (!) 177/138 (BP Location: Left Arm, Patient Position: Sitting, Cuff Size: Large)   Pulse 81   Temp 98.5 F (36.9 C) (Oral)   Ht 5' 8.5" (1.74 m)   Wt (!) 326 lb (147.9 kg)   SpO2 97%   BMI 48.85 kg/m  Wt Readings from Last 3 Encounters:  03/04/22 (!) 326 lb (147.9 kg)  05/06/17 276 lb (125.2 kg)  01/29/14 207 lb (93.9 kg)    Physical Exam Vitals and nursing note reviewed.  Constitutional:      Appearance: He is well-developed. He is obese.  HENT:     Head: Normocephalic and atraumatic.  Cardiovascular:     Rate and Rhythm: Normal rate and regular rhythm.     Heart sounds: Normal heart sounds. No murmur heard.    No friction rub. No gallop.  Pulmonary:     Effort: Pulmonary effort is normal. No tachypnea or respiratory distress.     Breath sounds: Normal breath sounds. No decreased breath sounds, wheezing, rhonchi or rales.  Chest:     Chest wall: No tenderness.  Abdominal:     General: Bowel sounds are normal.     Palpations: Abdomen is soft.  Musculoskeletal:        General: Normal range of motion.     Cervical back: Normal range of motion.  Skin:    General: Skin is warm and  dry.  Neurological:     Mental Status: He is alert and oriented to person, place, and time.     Coordination: Coordination normal.  Psychiatric:        Behavior: Behavior normal. Behavior is cooperative.        Thought Content: Thought content normal.        Judgment: Judgment normal.  Patient has been counseled extensively about nutrition and exercise as well as the importance of adherence with medications and regular follow-up. The patient was given clear instructions to go to ER or return to medical center if symptoms don't improve, worsen or new problems develop. The patient verbalized understanding.   Follow-up: Return for 3 weeks BP check with luke. If no appt. Can schedule for nurse visit. See me in 3 months.   Gildardo Pounds, FNP-BC [facility] and [place], [place]   03/04/2022, 5:24 PM

## 2022-03-05 LAB — LIPID PANEL
Chol/HDL Ratio: 4.5 ratio (ref 0.0–5.0)
Cholesterol, Total: 170 mg/dL (ref 100–199)
HDL: 38 mg/dL — ABNORMAL LOW (ref >39)
LDL Chol Calc (NIH): 106 mg/dL — ABNORMAL HIGH (ref 0–99)
Triglycerides: 148 mg/dL (ref 0–149)
VLDL Cholesterol Cal: 26 mg/dL (ref 5–40)

## 2022-03-05 LAB — HEMOGLOBIN A1C
Est. average glucose Bld gHb Est-mCnc: 131 mg/dL
Hgb A1c MFr Bld: 6.2 % — ABNORMAL HIGH (ref 4.8–5.6)

## 2022-03-05 LAB — CMP14+EGFR
ALT: 43 IU/L (ref 0–44)
AST: 26 IU/L (ref 0–40)
Albumin/Globulin Ratio: 1.7 (ref 1.2–2.2)
Albumin: 4.3 g/dL (ref 4.1–5.1)
Alkaline Phosphatase: 78 IU/L (ref 44–121)
BUN/Creatinine Ratio: 9 (ref 9–20)
BUN: 10 mg/dL (ref 6–24)
Bilirubin Total: 0.2 mg/dL (ref 0.0–1.2)
CO2: 25 mmol/L (ref 20–29)
Calcium: 9.4 mg/dL (ref 8.7–10.2)
Chloride: 102 mmol/L (ref 96–106)
Creatinine, Ser: 1.11 mg/dL (ref 0.76–1.27)
Globulin, Total: 2.5 g/dL (ref 1.5–4.5)
Glucose: 95 mg/dL (ref 70–99)
Potassium: 4.4 mmol/L (ref 3.5–5.2)
Sodium: 142 mmol/L (ref 134–144)
Total Protein: 6.8 g/dL (ref 6.0–8.5)
eGFR: 83 mL/min/{1.73_m2} (ref >59)

## 2022-03-05 LAB — HCV AB W REFLEX TO QUANT PCR: HCV Ab: NONREACTIVE

## 2022-03-05 LAB — HIV ANTIBODY (ROUTINE TESTING W REFLEX): HIV Screen 4th Generation wRfx: NONREACTIVE

## 2022-03-05 LAB — CBC WITH DIFFERENTIAL/PLATELET
Basophils Absolute: 0.1 10*3/uL (ref 0.0–0.2)
Basos: 1 %
EOS (ABSOLUTE): 0.1 10*3/uL (ref 0.0–0.4)
Eos: 2 %
Hematocrit: 51.2 % — ABNORMAL HIGH (ref 37.5–51.0)
Hemoglobin: 17.2 g/dL (ref 13.0–17.7)
Immature Grans (Abs): 0 10*3/uL (ref 0.0–0.1)
Immature Granulocytes: 0 %
Lymphocytes Absolute: 3.3 10*3/uL — ABNORMAL HIGH (ref 0.7–3.1)
Lymphs: 43 %
MCH: 29.8 pg (ref 26.6–33.0)
MCHC: 33.6 g/dL (ref 31.5–35.7)
MCV: 89 fL (ref 79–97)
Monocytes Absolute: 0.8 10*3/uL (ref 0.1–0.9)
Monocytes: 11 %
Neutrophils Absolute: 3.3 10*3/uL (ref 1.4–7.0)
Neutrophils: 43 %
Platelets: 241 10*3/uL (ref 150–450)
RBC: 5.78 x10E6/uL (ref 4.14–5.80)
RDW: 13.3 % (ref 11.6–15.4)
WBC: 7.6 10*3/uL (ref 3.4–10.8)

## 2022-03-05 LAB — HCV INTERPRETATION

## 2022-03-18 ENCOUNTER — Ambulatory Visit: Payer: Medicaid Other | Admitting: Critical Care Medicine

## 2022-04-02 ENCOUNTER — Ambulatory Visit: Payer: Medicaid Other | Attending: Nurse Practitioner | Admitting: Pharmacist

## 2022-04-02 ENCOUNTER — Encounter: Payer: Self-pay | Admitting: Pharmacist

## 2022-04-02 VITALS — BP 169/111

## 2022-04-02 DIAGNOSIS — Z79899 Other long term (current) drug therapy: Secondary | ICD-10-CM | POA: Insufficient documentation

## 2022-04-02 DIAGNOSIS — F17210 Nicotine dependence, cigarettes, uncomplicated: Secondary | ICD-10-CM | POA: Diagnosis not present

## 2022-04-02 DIAGNOSIS — E669 Obesity, unspecified: Secondary | ICD-10-CM | POA: Diagnosis not present

## 2022-04-02 DIAGNOSIS — I10 Essential (primary) hypertension: Secondary | ICD-10-CM | POA: Diagnosis not present

## 2022-04-02 DIAGNOSIS — K219 Gastro-esophageal reflux disease without esophagitis: Secondary | ICD-10-CM | POA: Insufficient documentation

## 2022-04-02 DIAGNOSIS — F319 Bipolar disorder, unspecified: Secondary | ICD-10-CM | POA: Insufficient documentation

## 2022-04-02 DIAGNOSIS — F209 Schizophrenia, unspecified: Secondary | ICD-10-CM | POA: Insufficient documentation

## 2022-04-02 DIAGNOSIS — K760 Fatty (change of) liver, not elsewhere classified: Secondary | ICD-10-CM | POA: Insufficient documentation

## 2022-04-02 DIAGNOSIS — F410 Panic disorder [episodic paroxysmal anxiety] without agoraphobia: Secondary | ICD-10-CM | POA: Diagnosis not present

## 2022-04-02 DIAGNOSIS — G4733 Obstructive sleep apnea (adult) (pediatric): Secondary | ICD-10-CM | POA: Insufficient documentation

## 2022-04-02 DIAGNOSIS — F4310 Post-traumatic stress disorder, unspecified: Secondary | ICD-10-CM | POA: Diagnosis not present

## 2022-04-02 MED ORDER — VALSARTAN 160 MG PO TABS: 160.0000 mg | ORAL_TABLET | Freq: Every day | ORAL | 1 refills | Status: DC

## 2022-04-02 NOTE — Progress Notes (Signed)
   S:     No chief complaint on file.  Cristian Anderson is a 46 y.o. male who presents for hypertension evaluation, education, and management. PMH is significant for Anxiety attack, Bipolar 1 disorder, Fatty liver, GERD, Hypertension, Obesity, Panic attack, OSA with CAP (followed by pulmonology). Tobacco use, PTSD, and Schizophrenia. Patient was referred and last seen by Primary Care Provider, Bertram Denver, on 03/04/2022.   At last visit, BP was very elevated but pt admitted to being without antihypertensives. His amlodipine was refilled and lisinopril was changed to valsartan.   Today, patient arrives in good spirits and presents without assistance. Denies dizziness, headache, blurred vision, swelling.   Patient reports hypertension is longstanding.   Family/Social history:  Fhx: DM, heart disease  Tobacco: current PPD smoker  Alcohol: none reported   Medication adherence reported. Patient has taken BP medications today.   Current antihypertensives include: valsartan 40 mg daily, amlodipine 10 mg daily   Reported home BP readings: none  Patient reported dietary habits: - Not limiting salt. Admits to adding salt when cooking and at the table. - Drinks 7-8 Pepsi's a day   Patient-reported exercise habits: none  O:  Vitals:   04/02/22 1500  BP: (!) 169/111    Last 3 Office BP readings: BP Readings from Last 3 Encounters:  04/02/22 (!) 169/111  03/04/22 (!) 177/138  06/14/18 (!) 156/93   BMET    Component Value Date/Time   NA 142 03/04/2022 0928   K 4.4 03/04/2022 0928   CL 102 03/04/2022 0928   CO2 25 03/04/2022 0928   GLUCOSE 95 03/04/2022 0928   GLUCOSE 91 09/14/2016 1201   BUN 10 03/04/2022 0928   CREATININE 1.11 03/04/2022 0928   CALCIUM 9.4 03/04/2022 0928   GFRNONAA >60 09/14/2016 1201   GFRAA >60 09/14/2016 1201   Renal function: CrCl cannot be calculated (Patient's most recent lab result is older than the maximum 21 days allowed.).  Clinical ASCVD: No   The 10-year ASCVD risk score (Arnett DK, et al., 2019) is: 21.4%   Values used to calculate the score:     Age: 89 years     Sex: Male     Is Non-Hispanic African American: Yes     Diabetic: No     Tobacco smoker: Yes     Systolic Blood Pressure: 169 mmHg     Is BP treated: Yes     HDL Cholesterol: 38 mg/dL     Total Cholesterol: 170 mg/dL  Patient is participating in a Managed Medicaid Plan:  Yes   A/P: Hypertension longstanding currently uncontrolled on current medications. BP goal < 130/80 mmHg. Medication adherence appears appropriate.  -Continued amlodipine 10 mg daily. -Increase valsartan dose to 160 mg daily.   -Patient educated on purpose, proper use, and potential adverse effects of valsartan.  -F/u labs ordered - none today. Anticipate CMP at follow-up in 1 month. -Counseled on lifestyle modifications for blood pressure control including reduced dietary sodium, increased exercise, adequate sleep. -Encouraged patient to check BP at home and bring log of readings to next visit. Counseled on proper use of home BP cuff.    Results reviewed and written information provided.    Written patient instructions provided. Patient verbalized understanding of treatment plan. Total time in face to face counseling 30 minutes.    Follow-up:  Pharmacist in 1 month.  Butch Penny, PharmD, P[place]y [place], CPP Clinical Pharmacist [facility] & [facility] [phone]

## 2022-05-08 ENCOUNTER — Ambulatory Visit: Payer: Medicaid Other | Admitting: Pharmacist

## 2022-05-30 ENCOUNTER — Encounter: Payer: Self-pay | Admitting: Nurse Practitioner

## 2022-06-08 ENCOUNTER — Encounter: Payer: Self-pay | Admitting: Nurse Practitioner

## 2022-06-08 ENCOUNTER — Ambulatory Visit: Payer: Medicaid Other | Attending: Nurse Practitioner | Admitting: Nurse Practitioner

## 2022-06-08 VITALS — BP 171/101 | HR 80 | Temp 98.0°F | Ht 68.0 in | Wt 344.2 lb

## 2022-06-08 DIAGNOSIS — Z79899 Other long term (current) drug therapy: Secondary | ICD-10-CM | POA: Insufficient documentation

## 2022-06-08 DIAGNOSIS — F17210 Nicotine dependence, cigarettes, uncomplicated: Secondary | ICD-10-CM | POA: Diagnosis not present

## 2022-06-08 DIAGNOSIS — I10 Essential (primary) hypertension: Secondary | ICD-10-CM | POA: Diagnosis present

## 2022-06-08 NOTE — Progress Notes (Signed)
Assessment & Plan:  Cristian Anderson was seen today for hypertension.  Diagnoses and all orders for this visit:  Primary hypertension -     CMP14+EGFR We will have him return for blood pressure check and device correlation.  If blood pressure readings are high based on home meter review we will increase valsartan  Patient has been counseled on age-appropriate routine health concerns for screening and prevention. These are reviewed and up-to-date. Referrals have been placed accordingly. Immunizations are up-to-date or declined.    Subjective:   Chief Complaint  Patient presents with   Hypertension   HPI Cristian Anderson 46 y.o. male presents to office today for follow-up to hypertension.  He is currently taking valsartan 160 mg daily and amlodipine 10 mg daily as prescribed.  He does smoke 1 pack of cigarettes per day and has several cigarettes prior to his office visit.  States blood pressure readings at home are much lower than office readings.  I have rechecked his blood pressure myself manually with reading 160/100.  He declines smoking cessation management today. BP Readings from Last 3 Encounters:  06/08/22 (!) 171/101  04/02/22 (!) 169/111  03/04/22 (!) 177/138     Review of Systems  Constitutional:  Negative for fever, malaise/fatigue and weight loss.  HENT: Negative.  Negative for nosebleeds.   Eyes: Negative.  Negative for blurred vision, double vision and photophobia.  Respiratory: Negative.  Negative for cough and shortness of breath.   Cardiovascular: Negative.  Negative for chest pain, palpitations and leg swelling.  Gastrointestinal: Negative.  Negative for heartburn, nausea and vomiting.  Musculoskeletal: Negative.  Negative for myalgias.  Neurological: Negative.  Negative for dizziness, focal weakness, seizures and headaches.  Psychiatric/Behavioral: Negative.  Negative for suicidal ideas.     Past Medical History:  Diagnosis Date   Anxiety attack    Bipolar 1 disorder  (HCC)    Fatty liver    GERD (gastroesophageal reflux disease)    Hypertension    Obesity    Panic attack    PTSD (post-traumatic stress disorder)    Schizophrenia (HCC)     History reviewed. No pertinent surgical history.  Family History  Problem Relation Age of Onset   Diabetes Father    Colon polyps Brother    Heart disease Maternal Aunt     Social History Reviewed with no changes to be made today.   Outpatient Medications Prior to Visit  Medication Sig Dispense Refill   amLODipine (NORVASC) 10 MG tablet Take 1 tablet (10 mg total) by mouth daily. 90 tablet 1   Blood Pressure Monitor DEVI Please provide patient with insurance approved blood pressure monitor 1 each 0   FLUoxetine (PROZAC) 20 MG capsule Take 20 mg by mouth 4 (four) times daily.     rosuvastatin (CRESTOR) 40 MG tablet Take 1 tablet (40 mg total) by mouth daily. 90 tablet 3   valsartan (DIOVAN) 160 MG tablet Take 1 tablet (160 mg total) by mouth daily. 90 tablet 1   No facility-administered medications prior to visit.    Allergies  Allergen Reactions   Penicillins Itching   Tylenol [Acetaminophen] Itching       Objective:    BP (!) 171/101   Pulse 80   Temp 98 F (36.7 C) (Temporal)   Ht 5\' 8"  (1.727 m)   Wt (!) 344 lb 3.2 oz (156.1 kg)   SpO2 96%   BMI 52.34 kg/m  Wt Readings from Last 3 Encounters:  06/08/22 (!) 344 lb  3.2 oz (156.1 kg)  03/04/22 (!) 326 lb (147.9 kg)  05/06/17 276 lb (125.2 kg)    Physical Exam Vitals and nursing note reviewed.  Constitutional:      Appearance: He is well-developed.  HENT:     Head: Normocephalic and atraumatic.  Cardiovascular:     Rate and Rhythm: Normal rate and regular rhythm.     Heart sounds: Normal heart sounds. No murmur heard.    No friction rub. No gallop.  Pulmonary:     Effort: Pulmonary effort is normal. No tachypnea or respiratory distress.     Breath sounds: Normal breath sounds. No decreased breath sounds, wheezing, rhonchi or  rales.  Chest:     Chest wall: No tenderness.  Abdominal:     General: Bowel sounds are normal.     Palpations: Abdomen is soft.  Musculoskeletal:        General: Normal range of motion.     Cervical back: Normal range of motion.  Skin:    General: Skin is warm and dry.  Neurological:     Mental Status: He is alert and oriented to person, place, and time.     Coordination: Coordination normal.  Psychiatric:        Behavior: Behavior normal. Behavior is cooperative.        Thought Content: Thought content normal.        Judgment: Judgment normal.          Patient has been counseled extensively about nutrition and exercise as well as the importance of adherence with medications and regular follow-up. The patient was given clear instructions to go to ER or return to medical center if symptoms don't improve, worsen or new problems develop. The patient verbalized understanding.   Follow-up: Return for double book 1110 on a wednesday in 2 weeks for BP CHeck.   Gildardo Pounds, FNP-BC [facility] and [facility], [place]   06/08/2022, 2:12 PM

## 2022-06-09 LAB — CMP14+EGFR
ALT: 51 IU/L — ABNORMAL HIGH (ref 0–44)
AST: 27 IU/L (ref 0–40)
Albumin/Globulin Ratio: 1.7 (ref 1.2–2.2)
Albumin: 4.3 g/dL (ref 4.1–5.1)
Alkaline Phosphatase: 75 IU/L (ref 44–121)
BUN/Creatinine Ratio: 8 — ABNORMAL LOW (ref 9–20)
BUN: 9 mg/dL (ref 6–24)
Bilirubin Total: 0.3 mg/dL (ref 0.0–1.2)
CO2: 25 mmol/L (ref 20–29)
Calcium: 9.2 mg/dL (ref 8.7–10.2)
Chloride: 103 mmol/L (ref 96–106)
Creatinine, Ser: 1.09 mg/dL (ref 0.76–1.27)
Globulin, Total: 2.5 g/dL (ref 1.5–4.5)
Glucose: 95 mg/dL (ref 70–99)
Potassium: 4.3 mmol/L (ref 3.5–5.2)
Sodium: 142 mmol/L (ref 134–144)
Total Protein: 6.8 g/dL (ref 6.0–8.5)
eGFR: 84 mL/min/{1.73_m2} (ref >59)

## 2022-06-22 ENCOUNTER — Ambulatory Visit: Payer: Medicaid Other | Attending: Nurse Practitioner | Admitting: Nurse Practitioner

## 2022-06-22 ENCOUNTER — Encounter: Payer: Self-pay | Admitting: Nurse Practitioner

## 2022-06-22 VITALS — BP 160/101 | HR 83 | Temp 98.0°F | Ht 68.0 in | Wt 344.0 lb

## 2022-06-22 DIAGNOSIS — F209 Schizophrenia, unspecified: Secondary | ICD-10-CM

## 2022-06-22 DIAGNOSIS — I10 Essential (primary) hypertension: Secondary | ICD-10-CM | POA: Diagnosis not present

## 2022-06-22 DIAGNOSIS — Z6841 Body Mass Index (BMI) 40.0 and over, adult: Secondary | ICD-10-CM | POA: Diagnosis not present

## 2022-06-22 MED ORDER — BLOOD PRESSURE MONITOR DEVI: 0 refills | Status: DC

## 2022-06-22 MED ORDER — VALSARTAN 40 MG PO TABS: 40.0000 mg | ORAL_TABLET | Freq: Every day | ORAL | 1 refills | Status: DC

## 2022-06-22 NOTE — Progress Notes (Unsigned)
   Assessment & Plan:  There are no diagnoses linked to this encounter.  Patient has been counseled on age-appropriate routine health concerns for screening and prevention. These are reviewed and up-to-date. Referrals have been placed accordingly. Immunizations are up-to-date or declined.    Subjective:   Chief Complaint  Patient presents with   Hypertension   HPI Cristian Anderson 46 y.o. male presents to office today for follow up to HTN   HTN  BP Readings from Last 3 Encounters:  06/22/22 (!) 160/101  06/08/22 (!) 171/101  04/02/22 (!) 169/111     ROS  Past Medical History:  Diagnosis Date   Anxiety attack    Bipolar 1 disorder ([place])    Fatty liver    GERD (gastroesophageal reflux disease)    Hypertension    Obesity    Panic attack    PTSD (post-traumatic stress disorder)    Schizophrenia ([place])     No past surgical history on file.  Family History  Problem Relation Age of Onset   Diabetes Father    Colon polyps Brother    Heart disease Maternal Aunt     Social History Reviewed with no changes to be made today.   Outpatient Medications Prior to Visit  Medication Sig Dispense Refill   amLODipine (NORVASC) 10 MG tablet Take 1 tablet (10 mg total) by mouth daily. 90 tablet 1   FLUoxetine (PROZAC) 20 MG capsule Take 20 mg by mouth 4 (four) times daily.     rosuvastatin (CRESTOR) 40 MG tablet Take 1 tablet (40 mg total) by mouth daily. 90 tablet 3   valsartan (DIOVAN) 160 MG tablet Take 1 tablet (160 mg total) by mouth daily. 90 tablet 1   Blood Pressure Monitor DEVI Please provide patient with insurance approved blood pressure monitor (Patient not taking: Reported on 06/22/2022) 1 each 0   No facility-administered medications prior to visit.    Allergies  Allergen Reactions   Penicillins Itching   Tylenol [Acetaminophen] Itching       Objective:    BP (!) 160/101   Pulse 83   Temp 98 F (36.7 C) (Temporal)   Ht 5\' 8"  (1.727 m)   Wt (!) 344 lb (156 kg)    SpO2 98%   BMI 52.31 kg/m  Wt Readings from Last 3 Encounters:  06/22/22 (!) 344 lb (156 kg)  06/08/22 (!) 344 lb 3.2 oz (156.1 kg)  03/04/22 (!) 326 lb (147.9 kg)    Physical Exam       Patient has been counseled extensively about nutrition and exercise as well as the importance of adherence with medications and regular follow-up. The patient was given clear instructions to go to ER or return to medical center if symptoms don't improve, worsen or new problems develop. The patient verbalized understanding.   Follow-up: No follow-ups on file.   Gildardo Pounds, FNP-BC [facility] and [facility] [place], [place]   06/22/2022, 11:25 AM

## 2022-06-24 ENCOUNTER — Encounter: Payer: Self-pay | Admitting: Nurse Practitioner

## 2022-06-24 MED ORDER — VALSARTAN 160 MG PO TABS: 160.0000 mg | ORAL_TABLET | Freq: Every day | ORAL | 3 refills | Status: DC

## 2022-07-06 ENCOUNTER — Telehealth: Payer: Self-pay

## 2022-07-06 ENCOUNTER — Other Ambulatory Visit: Payer: Self-pay | Admitting: Nurse Practitioner

## 2022-07-06 ENCOUNTER — Ambulatory Visit: Payer: Medicaid Other | Attending: Nurse Practitioner

## 2022-07-06 VITALS — BP 166/98

## 2022-07-06 DIAGNOSIS — I10 Essential (primary) hypertension: Secondary | ICD-10-CM

## 2022-07-06 DIAGNOSIS — Z0130 Encounter for examination of blood pressure without abnormal findings: Secondary | ICD-10-CM

## 2022-07-06 MED ORDER — VALSARTAN 320 MG PO TABS: 320.0000 mg | ORAL_TABLET | Freq: Every day | ORAL | 3 refills | Status: DC

## 2022-07-06 NOTE — Telephone Encounter (Signed)
Pt's BP today at visit 166/98 he states that he took his medication this morning but missed yesterday.

## 2022-07-06 NOTE — Telephone Encounter (Signed)
New dose of valsartan has been sent to the pharmacy. Needs BP check in 3 weeks with nurse after starting new dose

## 2022-07-06 NOTE — Progress Notes (Signed)
Pt arrived for BP check Vital were given to PCP for review.

## 2022-07-07 ENCOUNTER — Other Ambulatory Visit: Payer: Self-pay

## 2022-07-07 DIAGNOSIS — I10 Essential (primary) hypertension: Secondary | ICD-10-CM

## 2022-07-07 MED ORDER — VALSARTAN 320 MG PO TABS: 320.0000 mg | ORAL_TABLET | Freq: Every day | ORAL | 3 refills | Status: DC

## 2022-07-07 NOTE — Telephone Encounter (Signed)
Pt aware and appt scheduled.  

## 2022-07-30 ENCOUNTER — Ambulatory Visit: Payer: Medicaid Other | Attending: Nurse Practitioner

## 2022-07-30 NOTE — Progress Notes (Addendum)
Patient states that he is taking his medication as prescribed. Amlodipine 10 mg, 1 tab po daily and Valsartan 320 mg, 1 tab po daily.   BP was taken twice.  1st bp taken automatically on left arm with large cuff. BP was 170/102, pulse 88 and O2 94%. 2nd bp taken manually on left arm with large cuff. BP was 152/90, pulse 85 and O2 93%.

## 2022-07-31 ENCOUNTER — Other Ambulatory Visit: Payer: Self-pay | Admitting: Nurse Practitioner

## 2022-07-31 ENCOUNTER — Telehealth: Payer: Self-pay

## 2022-07-31 ENCOUNTER — Telehealth: Payer: Self-pay | Admitting: Nurse Practitioner

## 2022-07-31 DIAGNOSIS — I10 Essential (primary) hypertension: Secondary | ICD-10-CM

## 2022-07-31 MED ORDER — CARVEDILOL 6.25 MG PO TABS: 6.2500 mg | ORAL_TABLET | Freq: Two times a day (BID) | ORAL | 3 refills | Status: DC

## 2022-07-31 NOTE — Telephone Encounter (Signed)
Error

## 2022-07-31 NOTE — Telephone Encounter (Signed)
Patient identified by name and date of birth. Patient aware of new medications sent to pharmacy and nurse visit scheduled. Pt stated that he is using his CPAP every night.

## 2022-08-21 ENCOUNTER — Ambulatory Visit: Payer: Medicaid Other

## 2022-09-01 ENCOUNTER — Other Ambulatory Visit: Payer: Self-pay | Admitting: Nurse Practitioner

## 2022-09-01 ENCOUNTER — Telehealth: Payer: Self-pay

## 2022-09-01 ENCOUNTER — Ambulatory Visit: Payer: Medicaid Other | Attending: Nurse Practitioner

## 2022-09-01 DIAGNOSIS — I10 Essential (primary) hypertension: Secondary | ICD-10-CM

## 2022-09-01 MED ORDER — CARVEDILOL 12.5 MG PO TABS: 12.5000 mg | ORAL_TABLET | Freq: Two times a day (BID) | ORAL | 1 refills | Status: DC

## 2022-09-01 NOTE — Telephone Encounter (Signed)
Patient states that he missed his dosage of carvedilol 6.25 mg that is to be taken po twice a day, amlodipine 10 mg taken po once a day and valsartan 320 mg taken po once a day this past weekend, but other than that he has been taking his medication.   BP :188/107 HR :92 O2 : 97  2nd BP 180/98 

## 2022-09-01 NOTE — Telephone Encounter (Signed)
Increase carvedilol to 12.5 mg BID>patient has been instructed personally

## 2022-09-01 NOTE — Progress Notes (Deleted)
Patient states that he missed his dosage of carvedilol 6.25 mg that is to be taken po twice a day, amlodipine 10 mg taken po once a day and valsartan 320 mg taken po once a day this past weekend, but other than that he has been taking his medication.   BP :188/107 HR :92 O2 : 97  2nd BP 180/98

## 2022-09-01 NOTE — Progress Notes (Signed)
Patient states that he missed his dosage of carvedilol 6.25 mg that is to be taken po twice a day, amlodipine 10 mg taken po once a day and valsartan 320 mg taken po once a day this past weekend, but other than that he has been taking his medication.   BP :188/107 HR :92 O2 : 97  2nd BP 180/98 

## 2022-09-29 ENCOUNTER — Encounter: Payer: Self-pay | Admitting: Pharmacist

## 2022-09-29 ENCOUNTER — Ambulatory Visit: Payer: Medicaid Other | Attending: Nurse Practitioner | Admitting: Pharmacist

## 2022-09-29 VITALS — BP 136/95 | HR 82

## 2022-09-29 DIAGNOSIS — I10 Essential (primary) hypertension: Secondary | ICD-10-CM | POA: Diagnosis not present

## 2022-09-29 DIAGNOSIS — F17210 Nicotine dependence, cigarettes, uncomplicated: Secondary | ICD-10-CM | POA: Diagnosis not present

## 2022-09-29 DIAGNOSIS — Z79899 Other long term (current) drug therapy: Secondary | ICD-10-CM | POA: Insufficient documentation

## 2022-09-29 DIAGNOSIS — G4733 Obstructive sleep apnea (adult) (pediatric): Secondary | ICD-10-CM | POA: Insufficient documentation

## 2022-09-29 MED ORDER — VALSARTAN-HYDROCHLOROTHIAZIDE 320-25 MG PO TABS: 1.0000 | ORAL_TABLET | Freq: Every day | ORAL | 1 refills | Status: DC

## 2022-09-29 NOTE — Progress Notes (Signed)
S:     No chief complaint on file.  Cristian Anderson is a 47 y.o. male who presents for hypertension evaluation, education, and management. PMH is significant for Anxiety attack, Bipolar 1 disorder, Fatty liver, GERD, Hypertension, Obesity, Panic attack, OSA with CAP (followed by pulmonology). Tobacco use, PTSD, and Schizophrenia. Patient was referred and last seen by Primary Care Provider, Geryl Rankins, on 09/01/2022.   At last visit, BP was very elevated but pt admitted to being without antihypertensives. His amlodipine was refilled and lisinopril was changed to valsartan.   Today, patient arrives in good spirits and presents without assistance. Denies dizziness, headache, blurred vision, swelling.   Patient reports hypertension is longstanding.   Family/Social history:  Fhx: DM, heart disease  Tobacco: current PPD smoker  Alcohol: none reported   Medication adherence reported. Patient has taken BP medications today.   Current antihypertensives include: carvedilol 12.5 mg BID, valsartan 320 mg daily, amlodipine 10 mg daily   Reported home BP readings: none  Patient reported dietary habits: - Not limiting salt. Admits to adding salt when cooking and at the table. - Drinks 7-8 Pepsi's a day   Patient-reported exercise habits: none  O:  Vitals:   09/29/22 1516  BP: (!) 136/95  Pulse: 82    Last 3 Office BP readings: BP Readings from Last 3 Encounters:  09/29/22 (!) 136/95  07/06/22 (!) 166/98  06/22/22 (!) 160/101   BMET    Component Value Date/Time   NA 142 06/08/2022 1035   K 4.3 06/08/2022 1035   CL 103 06/08/2022 1035   CO2 25 06/08/2022 1035   GLUCOSE 95 06/08/2022 1035   GLUCOSE 91 09/14/2016 1201   BUN 9 06/08/2022 1035   CREATININE 1.09 06/08/2022 1035   CALCIUM 9.2 06/08/2022 1035   GFRNONAA >60 09/14/2016 1201   GFRAA >60 09/14/2016 1201   Renal function: CrCl cannot be calculated (Patient's most recent lab result is older than the maximum 21 days  allowed.).  Clinical ASCVD: No  The 10-year ASCVD risk score (Arnett DK, et al., 2019) is: 14.7%   Values used to calculate the score:     Age: 34 years     Sex: Male     Is Non-Hispanic African American: Yes     Diabetic: No     Tobacco smoker: Yes     Systolic Blood Pressure: 630 mmHg     Is BP treated: Yes     HDL Cholesterol: 38 mg/dL     Total Cholesterol: 170 mg/dL  Patient is participating in a Managed Medicaid Plan:  Yes   A/P: Hypertension longstanding currently uncontrolled on current medications. BP goal < 130/80 mmHg. Medication adherence appears appropriate.  -Continued amlodipine 10 mg daily. -Continued carvedilol 12.5 mg BID.  -Change valsartan to valsartan-HCTZ 320-25 mg daily. -Patient educated on purpose, proper use, and potential adverse effects of valsartan-HCTZ.  -F/u labs ordered - none today. Anticipate CMP at follow-up in 1 month. -Counseled on lifestyle modifications for blood pressure control including reduced dietary sodium, increased exercise, adequate sleep. -Encouraged patient to check BP at home and bring log of readings to next visit. Counseled on proper use of home BP cuff.    Results reviewed and written information provided.    Written patient instructions provided. Patient verbalized understanding of treatment plan. Total time in face to face counseling 30 minutes.    Follow-up:  Pharmacist in 1 month.  Benard Halsted, PharmD, BCACP, [place]  [place] [phone]

## 2022-10-28 NOTE — Progress Notes (Deleted)
   S:     PCP: Gaius Evan is a 47 y.o. male who presents for hypertension evaluation, education, and management. PMH is significant for Anxiety attack, Bipolar 1 disorder, Fatty liver, GERD, Hypertension, Obesity, Panic attack, OSA with CAP (followed by pulmonology). Tobacco use, PTSD, and Schizophrenia. Patient was referred and last seen by Primary Care Provider, Geryl Rankins, on 09/01/2022.   At last visit with PCP, BP was very elevated but pt admitted to being without antihypertensives. His amlodipine was refilled and lisinopril was changed to valsartan.   At last visit with clinical pharmacist, BP remained elevated. He valsartan was switched to valsartan/hctz.   Today, patient arrives in *** spirits and presents without *** assistance. *** Denies dizziness, headache, blurred vision, swelling.   Patient reports hypertension is longstanding.    Family/Social history:  Fhx: DM, heart disease  Tobacco: current PPD smoker  Alcohol: none reported   Medication adherence *** . Patient has *** taken BP medications today.   Current antihypertensives include: carvedilol 12.5 mg BID, valsartan/hctz 320/25 mg daily, amlodipine 10 mg daily   Reported home BP readings: ***  Patient reported dietary habits: - Not limiting salt. Admits to adding salt when cooking and at the table. - Drinks 7-8 Pepsi's a day    Patient-reported exercise habits: none  O:  ROS  Physical Exam  Last 3 Office BP readings: BP Readings from Last 3 Encounters:  09/29/22 (!) 136/95  07/06/22 (!) 166/98  06/22/22 (!) 160/101    BMET    Component Value Date/Time   NA 142 06/08/2022 1035   K 4.3 06/08/2022 1035   CL 103 06/08/2022 1035   CO2 25 06/08/2022 1035   GLUCOSE 95 06/08/2022 1035   GLUCOSE 91 09/14/2016 1201   BUN 9 06/08/2022 1035   CREATININE 1.09 06/08/2022 1035   CALCIUM 9.2 06/08/2022 1035   GFRNONAA >60 09/14/2016 1201   GFRAA >60 09/14/2016 1201    Renal  function: CrCl cannot be calculated (Patient's most recent lab result is older than the maximum 21 days allowed.).  Clinical ASCVD: No  The 10-year ASCVD risk score (Arnett DK, et al., 2019) is: 14.7%   Values used to calculate the score:     Age: 48 years     Sex: Male     Is Non-Hispanic African American: Yes     Diabetic: No     Tobacco smoker: Yes     Systolic Blood Pressure: [id number] mmHg     Is BP treated: Yes     HDL Cholesterol: 38 mg/dL     Total Cholesterol: 170 mg/dL    A/P: Hypertension diagnosed *** currently *** on current medications. BP goal < 130/80 *** mmHg. Medication adherence appears ***. Control is suboptimal due to ***.  -{Meds adjust:18428} ***.  -{Meds adjust:18428} ***.  -Patient educated on purpose, proper use, and potential adverse effects of ***.  -F/u labs ordered - *** -Counseled on lifestyle modifications for blood pressure control including reduced dietary sodium, increased exercise, adequate sleep. -Encouraged patient to check BP at home and bring log of readings to next visit. Counseled on proper use of home BP cuff.   Results reviewed and written information provided.    Written patient instructions provided. Patient verbalized understanding of treatment plan.  Total time in face to face counseling *** minutes.    Follow-up:  Pharmacist ***.  Maryan Puls, PharmD PGY-1 [facility] Resident

## 2022-10-29 ENCOUNTER — Inpatient Hospital Stay (HOSPITAL_COMMUNITY)
Admission: EM | Admit: 2022-10-29 | Discharge: 2022-11-01 | DRG: 193 | Disposition: A | Discharge status: Home / Self Care | Payer: Medicaid Other | Attending: Internal Medicine | Admitting: Internal Medicine

## 2022-10-29 ENCOUNTER — Emergency Department (HOSPITAL_COMMUNITY): Payer: Medicaid Other

## 2022-10-29 ENCOUNTER — Encounter (HOSPITAL_COMMUNITY): Payer: Self-pay

## 2022-10-29 ENCOUNTER — Ambulatory Visit: Payer: Medicaid Other | Admitting: Pharmacist

## 2022-10-29 DIAGNOSIS — G9341 Metabolic encephalopathy: Secondary | ICD-10-CM

## 2022-10-29 DIAGNOSIS — Z79899 Other long term (current) drug therapy: Secondary | ICD-10-CM

## 2022-10-29 DIAGNOSIS — J189 Pneumonia, unspecified organism: Principal | ICD-10-CM

## 2022-10-29 DIAGNOSIS — Z83719 Family history of colon polyps, unspecified: Secondary | ICD-10-CM | POA: Diagnosis not present

## 2022-10-29 DIAGNOSIS — Z6841 Body Mass Index (BMI) 40.0 and over, adult: Secondary | ICD-10-CM | POA: Diagnosis not present

## 2022-10-29 DIAGNOSIS — Z833 Family history of diabetes mellitus: Secondary | ICD-10-CM

## 2022-10-29 DIAGNOSIS — K219 Gastro-esophageal reflux disease without esophagitis: Secondary | ICD-10-CM | POA: Diagnosis present

## 2022-10-29 DIAGNOSIS — N179 Acute kidney failure, unspecified: Secondary | ICD-10-CM | POA: Diagnosis not present

## 2022-10-29 DIAGNOSIS — J441 Chronic obstructive pulmonary disease with (acute) exacerbation: Secondary | ICD-10-CM | POA: Diagnosis present

## 2022-10-29 DIAGNOSIS — E662 Morbid (severe) obesity with alveolar hypoventilation: Secondary | ICD-10-CM | POA: Diagnosis present

## 2022-10-29 DIAGNOSIS — F4310 Post-traumatic stress disorder, unspecified: Secondary | ICD-10-CM | POA: Diagnosis present

## 2022-10-29 DIAGNOSIS — R0902 Hypoxemia: Secondary | ICD-10-CM | POA: Diagnosis present

## 2022-10-29 DIAGNOSIS — Z886 Allergy status to analgesic agent status: Secondary | ICD-10-CM | POA: Diagnosis not present

## 2022-10-29 DIAGNOSIS — K760 Fatty (change of) liver, not elsewhere classified: Secondary | ICD-10-CM | POA: Diagnosis present

## 2022-10-29 DIAGNOSIS — R110 Nausea: Secondary | ICD-10-CM | POA: Diagnosis present

## 2022-10-29 DIAGNOSIS — F319 Bipolar disorder, unspecified: Secondary | ICD-10-CM | POA: Diagnosis present

## 2022-10-29 DIAGNOSIS — F209 Schizophrenia, unspecified: Secondary | ICD-10-CM | POA: Diagnosis present

## 2022-10-29 DIAGNOSIS — J440 Chronic obstructive pulmonary disease with acute lower respiratory infection: Secondary | ICD-10-CM | POA: Diagnosis present

## 2022-10-29 DIAGNOSIS — I10 Essential (primary) hypertension: Secondary | ICD-10-CM | POA: Diagnosis present

## 2022-10-29 DIAGNOSIS — R0603 Acute respiratory distress: Secondary | ICD-10-CM | POA: Diagnosis present

## 2022-10-29 DIAGNOSIS — F17210 Nicotine dependence, cigarettes, uncomplicated: Secondary | ICD-10-CM | POA: Diagnosis present

## 2022-10-29 DIAGNOSIS — Z880 Allergy status to penicillin: Secondary | ICD-10-CM | POA: Diagnosis not present

## 2022-10-29 DIAGNOSIS — Z1152 Encounter for screening for COVID-19: Secondary | ICD-10-CM | POA: Diagnosis not present

## 2022-10-29 DIAGNOSIS — Z8249 Family history of ischemic heart disease and other diseases of the circulatory system: Secondary | ICD-10-CM

## 2022-10-29 DIAGNOSIS — G4733 Obstructive sleep apnea (adult) (pediatric): Secondary | ICD-10-CM | POA: Diagnosis present

## 2022-10-29 LAB — CBC WITH DIFFERENTIAL/PLATELET
Abs Immature Granulocytes: 0.04 10*3/uL (ref 0.00–0.07)
Basophils Absolute: 0.1 10*3/uL (ref 0.0–0.1)
Basophils Relative: 0 %
Eosinophils Absolute: 0.1 10*3/uL (ref 0.0–0.5)
Eosinophils Relative: 1 %
HCT: 49.1 % (ref 39.0–52.0)
Hemoglobin: 16.4 g/dL (ref 13.0–17.0)
Immature Granulocytes: 0 %
Lymphocytes Relative: 40 %
Lymphs Abs: 5.9 10*3/uL — ABNORMAL HIGH (ref 0.7–4.0)
MCH: 29.9 pg (ref 26.0–34.0)
MCHC: 33.4 g/dL (ref 30.0–36.0)
MCV: 89.4 fL (ref 80.0–100.0)
Monocytes Absolute: 1.5 10*3/uL — ABNORMAL HIGH (ref 0.1–1.0)
Monocytes Relative: 10 %
Neutro Abs: 7.2 10*3/uL (ref 1.7–7.7)
Neutrophils Relative %: 49 %
Platelets: 267 10*3/uL (ref 150–400)
RBC: 5.49 MIL/uL (ref 4.22–5.81)
RDW: 13.2 % (ref 11.5–15.5)
WBC: 14.8 10*3/uL — ABNORMAL HIGH (ref 4.0–10.5)
nRBC: 0 % (ref 0.0–0.2)

## 2022-10-29 LAB — BASIC METABOLIC PANEL
Anion gap: 13 (ref 5–15)
BUN: 10 mg/dL (ref 6–20)
CO2: 26 mmol/L (ref 22–32)
Calcium: 9.3 mg/dL (ref 8.9–10.3)
Chloride: 99 mmol/L (ref 98–111)
Creatinine, Ser: 1.49 mg/dL — ABNORMAL HIGH (ref 0.61–1.24)
GFR, Estimated: 58 mL/min — ABNORMAL LOW (ref >60)
Glucose, Bld: 130 mg/dL — ABNORMAL HIGH (ref 70–99)
Potassium: 3.6 mmol/L (ref 3.5–5.1)
Sodium: 138 mmol/L (ref 135–145)

## 2022-10-29 LAB — I-STAT VENOUS BLOOD GAS, ED
Acid-Base Excess: 3 mmol/L — ABNORMAL HIGH (ref 0.0–2.0)
Bicarbonate: 28.3 mmol/L — ABNORMAL HIGH (ref 20.0–28.0)
Calcium, Ion: 1.17 mmol/L (ref 1.15–1.40)
HCT: 47 % (ref 39.0–52.0)
Hemoglobin: 16 g/dL (ref 13.0–17.0)
O2 Saturation: 92 %
Potassium: 3.9 mmol/L (ref 3.5–5.1)
Sodium: 139 mmol/L (ref 135–145)
TCO2: 30 mmol/L (ref 22–32)
pCO2, Ven: 46.1 mmHg (ref 44–60)
pH, Ven: 7.397 (ref 7.25–7.43)
pO2, Ven: 63 mmHg — ABNORMAL HIGH (ref 32–45)

## 2022-10-29 LAB — RESP PANEL BY RT-PCR (RSV, FLU A&B, COVID)  RVPGX2
Influenza A by PCR: NEGATIVE
Influenza B by PCR: NEGATIVE
Resp Syncytial Virus by PCR: NEGATIVE
SARS Coronavirus 2 by RT PCR: NEGATIVE

## 2022-10-29 LAB — BRAIN NATRIURETIC PEPTIDE: B Natriuretic Peptide: 30.8 pg/mL (ref 0.0–100.0)

## 2022-10-29 MED ORDER — SODIUM CHLORIDE 0.9 % IV SOLN: 500.0000 mg | INTRAVENOUS | Status: DC

## 2022-10-29 MED ORDER — SODIUM CHLORIDE 0.9 % IV BOLUS
1000.0000 mL | Freq: Once | INTRAVENOUS | Status: AC
  Administered 2022-10-29: 1000 mL via INTRAVENOUS

## 2022-10-29 MED ORDER — SODIUM CHLORIDE 0.9 % IV SOLN
1.0000 g | Freq: Once | INTRAVENOUS | Status: AC
  Administered 2022-10-29: 1 g via INTRAVENOUS
  Filled 2022-10-29: qty 10

## 2022-10-29 MED ORDER — VALSARTAN-HYDROCHLOROTHIAZIDE 320-25 MG PO TABS: 1.0000 | ORAL_TABLET | Freq: Every day | ORAL | Status: DC

## 2022-10-29 MED ORDER — SODIUM CHLORIDE 0.9 % IV SOLN
500.0000 mg | INTRAVENOUS | Status: DC
  Administered 2022-10-29 – 2022-10-30 (×2): 500 mg via INTRAVENOUS
  Filled 2022-10-29: qty 5

## 2022-10-29 MED ORDER — ALBUTEROL SULFATE (2.5 MG/3ML) 0.083% IN NEBU
5.0000 mg | INHALATION_SOLUTION | Freq: Once | RESPIRATORY_TRACT | Status: DC
  Filled 2022-10-29: qty 6

## 2022-10-29 MED ORDER — HYDROCHLOROTHIAZIDE 25 MG PO TABS
25.0000 mg | ORAL_TABLET | Freq: Every day | ORAL | Status: DC
  Administered 2022-10-30 – 2022-11-01 (×3): 25 mg via ORAL
  Filled 2022-10-29 (×3): qty 1

## 2022-10-29 MED ORDER — CARVEDILOL 12.5 MG PO TABS
12.5000 mg | ORAL_TABLET | Freq: Two times a day (BID) | ORAL | Status: DC
  Administered 2022-10-30 – 2022-11-01 (×5): 12.5 mg via ORAL
  Filled 2022-10-29 (×5): qty 1

## 2022-10-29 MED ORDER — KETOROLAC TROMETHAMINE 30 MG/ML IJ SOLN
30.0000 mg | Freq: Once | INTRAMUSCULAR | Status: AC
  Administered 2022-10-29: 30 mg via INTRAVENOUS
  Filled 2022-10-29: qty 1

## 2022-10-29 MED ORDER — IPRATROPIUM-ALBUTEROL 0.5-2.5 (3) MG/3ML IN SOLN
RESPIRATORY_TRACT | Status: AC
  Filled 2022-10-29: qty 3

## 2022-10-29 MED ORDER — SODIUM CHLORIDE 0.9 % IV SOLN
500.0000 mg | Freq: Once | INTRAVENOUS | Status: DC
  Filled 2022-10-29: qty 5

## 2022-10-29 MED ORDER — ENOXAPARIN SODIUM 80 MG/0.8ML IJ SOSY
80.0000 mg | PREFILLED_SYRINGE | INTRAMUSCULAR | Status: DC
  Administered 2022-10-29 – 2022-10-31 (×3): 80 mg via SUBCUTANEOUS
  Filled 2022-10-29 (×3): qty 0.8

## 2022-10-29 MED ORDER — IPRATROPIUM-ALBUTEROL 0.5-2.5 (3) MG/3ML IN SOLN
3.0000 mL | Freq: Once | RESPIRATORY_TRACT | Status: DC
  Filled 2022-10-29: qty 3

## 2022-10-29 MED ORDER — AMLODIPINE BESYLATE 10 MG PO TABS
10.0000 mg | ORAL_TABLET | Freq: Every day | ORAL | Status: DC
  Administered 2022-10-30 – 2022-11-01 (×3): 10 mg via ORAL
  Filled 2022-10-29 (×2): qty 1
  Filled 2022-10-29: qty 2

## 2022-10-29 MED ORDER — SODIUM CHLORIDE 0.9 % IV SOLN
2.0000 g | INTRAVENOUS | Status: DC
  Administered 2022-10-30 – 2022-10-31 (×2): 2 g via INTRAVENOUS
  Filled 2022-10-29 (×2): qty 20

## 2022-10-29 MED ORDER — IRBESARTAN 300 MG PO TABS
300.0000 mg | ORAL_TABLET | Freq: Every day | ORAL | Status: DC
  Administered 2022-10-30 – 2022-11-01 (×3): 300 mg via ORAL
  Filled 2022-10-29 (×3): qty 1

## 2022-10-29 MED ORDER — IBUPROFEN 400 MG PO TABS
800.0000 mg | ORAL_TABLET | Freq: Once | ORAL | Status: DC
  Filled 2022-10-29: qty 1

## 2022-10-29 NOTE — ED Triage Notes (Signed)
Pt BIBA from home. Pt c/o SHOB. EMS attempted CPAP, pt would not tolerate  Pt received 2 duonebs, 2g mag, 125 solumedrol

## 2022-10-29 NOTE — Assessment & Plan Note (Signed)
-  continue home amlodipine, Coreg, valsartan-HCTZ

## 2022-10-29 NOTE — H&P (Signed)
History and Physical    Patient: Cristian Anderson DOB: March 02, 1976 DOA: 10/29/2022 DOS: the patient was seen and examined on 10/29/2022 PCP: Gildardo Pounds, NP  Patient coming from: Home  Chief Complaint:  Chief Complaint  Patient presents with   Respiratory Distress   HPI: Cristian Anderson is a 47 y.o. male with medical history significant of hypertension, OSA with CPAP, morbid obesity, bipolar 1 disorder, PTSD, schizophrenia, anxiety who presents with increasing shortness of breath.  Pt is lethargic on evaluation and provides limited history. States he was otherwise in his normal state of health but just became acutely short of breath today and just could not breath. No cough. No chest pain or LE edema. Felt nauseous but no vomiting. No diarrhea. Smokes cigarettes but did not answer quantity.    In the field, EMS attempted CPAP but patient could not tolerate although does wear CPAP at night for OSA.  He was given 2 DuoNebs, 2 g IV mag and 125 mg Solu-Medrol.  In the ED, febrile up to 101.50F, heart rate of 140, RR 26, BP of 125/78 and placed on 2 L more for comfort without documented hypoxia.  Mild leukocytosis of 14.8, hemoglobin of 16.4 No significant electrolyte normalities but has AKI with creatinine 1.49.  Chest x-ray on my review limited due to rotated positioning with left mid opacity and mild cardiomegaly.  Negative flu/COVID/RSV   He was given additional DuoNebs and started on IV Rocephin and azithromycin in ED.  Hospitalist consulted for community-acquired pneumonia. Review of Systems: As mentioned in the history of present illness. All other systems reviewed and are negative. Past Medical History:  Diagnosis Date   Anxiety attack    Bipolar 1 disorder ([place])    Fatty liver    GERD (gastroesophageal reflux disease)    Hypertension    Obesity    Panic attack    PTSD (post-traumatic stress disorder)    Schizophrenia ([place])    History reviewed. No pertinent surgical  history. Social History:  reports that he has been smoking cigarettes. He has been smoking an average of 1 pack per day. He has never used smokeless tobacco. He reports that he does not drink alcohol and does not use drugs.  Allergies  Allergen Reactions   Penicillins Itching   Tylenol [Acetaminophen] Itching    Family History  Problem Relation Age of Onset   Diabetes Father    Colon polyps Brother    Heart disease Maternal Aunt     Prior to Admission medications   Medication Sig Start Date End Date Taking? Authorizing Provider  amLODipine (NORVASC) 10 MG tablet Take 1 tablet (10 mg total) by mouth daily. 03/04/22   Gildardo Pounds, NP  Blood Pressure Monitor DEVI Please provide patient with insurance approved blood pressure . Device broken. 06/22/22   Gildardo Pounds, NP  carvedilol (COREG) 12.5 MG tablet Take 1 tablet (12.5 mg total) by mouth 2 (two) times daily with a meal. 09/01/22   Gildardo Pounds, NP  FLUoxetine (PROZAC) 20 MG capsule Take 20 mg by mouth 4 (four) times daily.    [provider]  rosuvastatin (CRESTOR) 40 MG tablet Take 1 tablet (40 mg total) by mouth daily. 03/04/22   Gildardo Pounds, NP  valsartan-hydrochlorothiazide (DIOVAN-HCT) 320-25 MG tablet Take 1 tablet by mouth daily. 09/29/22   Charlott Rakes, MD  carvedilol (COREG) 6.25 MG tablet Take 1 tablet (6.25 mg total) by mouth 2 (two) times daily with a meal. 07/31/22  Gildardo Pounds, NP    Physical Exam: Vitals:   10/29/22 1830 10/29/22 1857 10/29/22 1915 10/29/22 1930  BP: 130/89  (!) 182/92 (!) 147/77  Pulse: (!) 115  (!) 101 78  Resp: 16  (!) 25 (!) 21  Temp:  98.6 F (37 C)    TempSrc:  Oral    SpO2: 98%  97% 98%  Weight:      Height:       Constitutional: NAD, calm, comfortable, lethargic appearing obese male sitting upright in bed.  Awakes to sternal rub but provides limited history and requires redirection to wake up. Eyes: Kept eyes closed throughout evaluation. ENMT: Mucous  membranes are moist. Neck: normal, supple, Respiratory: Faint upper lobe expiratory wheezes but no crackles. Normal respiratory effort on 2 L. No accessory muscle use.  Not able to sit up in bed without assistance and could only roll over for posterior auscultation. Cardiovascular: Regular rate and rhythm, no murmurs / rubs / gallops. No extremity edema.  Abdomen: no tenderness, Soft, Bowel sounds positive.  Musculoskeletal: no clubbing / cyanosis. No joint deformity upper and lower extremities. Good ROM, no contractures. Normal muscle tone.  Skin: no rashes, lesions, ulcers.  Neurologic: CN 2-12 grossly intact.  Able to wake up to noxious stimuli but limited in response to questioning. Psychiatric: Lethargic  data Reviewed:  See HPI   Assessment and Plan: * Acute respiratory distress -secondary to community acquired pneumonia and possible underlying hypoventilation syndrome from morbid obesity -no hypoxia but requiring 2L O2 via [place] for tachypneia  -CXR with left mid-lung opacity and is febrile concerning for pneumonia -Negative flu/COVID/RSV PCR -continue IV Rocephin and azithromycin -will also check VBG given lethargy to assess for hypercapnia  Acute metabolic encephalopathy -appears lethargic on exam despite improved respiratory status. Check stat VBG -could be secondary to pneumonia, underlying OSA and likely hypoventilation syndrome from morbid obesity  AKI (acute kidney injury) ([place]) -creatinine elevated at 1.49. Will give IV fluid bolus and follow in the morning.   OSA (obstructive sleep apnea) Normally wears CPAP at night but will hold tonight due to lethargy  BMI 50.0-59.9, adult (HCC) BMI of 53  Essential hypertension -continue home amlodipine, Coreg, valsartan-HCTZ      Advance Care Planning: Full  Consults: none  Family Communication: none at bedside  Severity of Illness: The appropriate patient status for this patient is INPATIENT. Inpatient status is  judged to be reasonable and necessary in order to provide the required intensity of service to ensure the patient's safety. The patient's presenting symptoms, physical exam findings, and initial radiographic and laboratory data in the context of their chronic comorbidities is felt to place them at high risk for further clinical deterioration. Furthermore, it is not anticipated that the patient will be medically stable for discharge from the hospital within 2 midnights of admission.   * I certify that at the point of admission it is my clinical judgment that the patient will require inpatient hospital care spanning beyond 2 midnights from the point of admission due to high intensity of service, high risk for further deterioration and high frequency of surveillance required.*  Author: Orene Desanctis, DO 10/29/2022 8:38 PM  For on call review [email or web address].

## 2022-10-29 NOTE — ED Notes (Signed)
Pt complaining of leg "discomfort," denies pain. Provider notified

## 2022-10-29 NOTE — Assessment & Plan Note (Signed)
BMI of 53

## 2022-10-29 NOTE — Assessment & Plan Note (Signed)
-  appears lethargic on exam despite improved respiratory status. Check stat VBG -could be secondary to pneumonia, underlying OSA and likely hypoventilation syndrome from morbid obesity

## 2022-10-29 NOTE — Assessment & Plan Note (Addendum)
-  secondary to community acquired pneumonia and possible underlying hypoventilation syndrome from morbid obesity -no hypoxia but requiring 2L O2 via [place] for tachypneia  -CXR with left mid-lung opacity and is febrile concerning for pneumonia -Negative flu/COVID/RSV PCR -continue IV Rocephin and azithromycin -will also check VBG given lethargy to assess for hypercapnia

## 2022-10-29 NOTE — Assessment & Plan Note (Signed)
-  creatinine elevated at 1.49. Will give IV fluid bolus and follow in the morning.

## 2022-10-29 NOTE — Assessment & Plan Note (Signed)
Normally wears CPAP at night but will hold tonight due to lethargy

## 2022-10-29 NOTE — ED Notes (Signed)
Pt extremely anxious

## 2022-10-29 NOTE — ED Provider Notes (Signed)
[facility] Provider Note   CSN: [id number] Arrival date & time: 10/29/22  1746     History  Chief Complaint  Patient presents with   Respiratory Distress    Cristian Anderson is a 47 y.o. male.  Pt is a 47 yo male with pmhx significant for HTN, schizophrenia, morbid obesity, GERD, PTSD, bipolar d/o, OSA, and tobacco abuse.  Pt is unable to give a good hx due to severity of sob, but he called EMS today b/c sob has been bad today.  EMS gave him 2 duonebs, 2g mg, and 125 solumedrol.  EMS tried CPAP, but pt was unable to tolerated.       Home Medications Prior to Admission medications   Medication Sig Start Date End Date Taking? Authorizing Provider  amLODipine (NORVASC) 10 MG tablet Take 1 tablet (10 mg total) by mouth daily. 03/04/22   Gildardo Pounds, NP  Blood Pressure Monitor DEVI Please provide patient with insurance approved blood pressure . Device broken. 06/22/22   Gildardo Pounds, NP  carvedilol (COREG) 12.5 MG tablet Take 1 tablet (12.5 mg total) by mouth 2 (two) times daily with a meal. 09/01/22   Gildardo Pounds, NP  FLUoxetine (PROZAC) 20 MG capsule Take 20 mg by mouth 4 (four) times daily.    [provider]  rosuvastatin (CRESTOR) 40 MG tablet Take 1 tablet (40 mg total) by mouth daily. 03/04/22   Gildardo Pounds, NP  valsartan-hydrochlorothiazide (DIOVAN-HCT) 320-25 MG tablet Take 1 tablet by mouth daily. 09/29/22   Charlott Rakes, MD  carvedilol (COREG) 6.25 MG tablet Take 1 tablet (6.25 mg total) by mouth 2 (two) times daily with a meal. 07/31/22   Gildardo Pounds, NP      Allergies    Penicillins and Tylenol [acetaminophen]    Review of Systems   Review of Systems  Respiratory:  Positive for shortness of breath.   All other systems reviewed and are negative.   Physical Exam Updated Vital Signs BP 130/89   Pulse (!) 115   Temp 98.6 F (37 C) (Oral)   Resp 16   Ht '5\' 8"'$  (1.727 m)   Wt (!) 158.8 kg   SpO2  98%   BMI 53.22 kg/m  Physical Exam Vitals and nursing note reviewed.  Constitutional:      General: He is in acute distress.     Appearance: Normal appearance. He is obese.  HENT:     Head: Normocephalic and atraumatic.     Right Ear: External ear normal.     Left Ear: External ear normal.     Nose: Nose normal.     Mouth/Throat:     Mouth: Mucous membranes are dry.  Eyes:     Extraocular Movements: Extraocular movements intact.     Conjunctiva/sclera: Conjunctivae normal.     Pupils: Pupils are equal, round, and reactive to light.  Cardiovascular:     Rate and Rhythm: Regular rhythm. Tachycardia present.     Pulses: Normal pulses.     Heart sounds: Normal heart sounds.  Pulmonary:     Effort: Respiratory distress present.     Breath sounds: Decreased air movement present. Decreased breath sounds present.  Abdominal:     General: Abdomen is flat. Bowel sounds are normal.     Palpations: Abdomen is soft.  Musculoskeletal:        General: Normal range of motion.     Cervical back: Normal range of  motion and neck supple.  Skin:    General: Skin is warm.     Capillary Refill: Capillary refill takes less than 2 seconds.  Neurological:     General: No focal deficit present.     Mental Status: He is alert and oriented to person, place, and time.  Psychiatric:        Mood and Affect: Mood normal.        Behavior: Behavior normal.     ED Results / Procedures / Treatments   Labs (all labs ordered are listed, but only abnormal results are displayed) Labs Reviewed  BASIC METABOLIC PANEL - Abnormal; Notable for the following components:      Result Value   Glucose, Bld 130 (*)    Creatinine, Ser 1.49 (*)    GFR, Estimated 58 (*)    All other components within normal limits  CBC WITH DIFFERENTIAL/PLATELET - Abnormal; Notable for the following components:   WBC 14.8 (*)    Lymphs Abs 5.9 (*)    Monocytes Absolute 1.5 (*)    All other components within normal limits  RESP  PANEL BY RT-PCR (RSV, FLU A&B, COVID)  RVPGX2  BRAIN NATRIURETIC PEPTIDE    EKG EKG Interpretation  Date/Time:  Thursday October 29 2022 17:50:49 EST Ventricular Rate:  126 PR Interval:  159 QRS Duration: 63 QT Interval:  292 QTC Calculation: 423 R Axis:   83 Text Interpretation: Sinus tachycardia Borderline T wave abnormalities No significant change since last tracing Confirmed by Isla Pence [phone]) on 10/29/2022 6:15:19 PM  Radiology DG Chest Port 1 View  Result Date: 10/29/2022 CLINICAL DATA:  Shortness of breath. EXAM: PORTABLE CHEST 1 VIEW COMPARISON:  Chest radiograph dated 09/14/2016. FINDINGS: Evaluation is limited due to body habitus and patient's positioning. Mild cardiomegaly with mild central vascular congestion. Left mid lung field pleural base crescentic opacity is not evaluated but may represent an artifact related to skin fold. A loculated effusion or other etiologies are not excluded. Repeat radiograph with better positioning of the patient is recommended for better evaluation. No pneumothorax. No acute osseous pathology. IMPRESSION: 1. Mild cardiomegaly with mild central vascular congestion. 2. Indeterminate left mid lung field pleural base crescentic opacity. Repeat radiograph with better positioning of the patient is recommended. Electronically Signed   By: Anner Crete M.D.   On: 10/29/2022 18:41    Procedures Procedures    Medications Ordered in ED Medications  ipratropium-albuterol (DUONEB) 0.5-2.5 (3) MG/3ML nebulizer solution 3 mL (0 mLs Nebulization Hold 10/29/22 1757)  ibuprofen (ADVIL) tablet 800 mg (800 mg Oral Patient Refused/Not Given 10/29/22 1757)  albuterol (PROVENTIL) (2.5 MG/3ML) 0.083% nebulizer solution 5 mg (has no administration in time range)  cefTRIAXone (ROCEPHIN) 1 g in sodium chloride 0.9 % 100 mL IVPB (has no administration in time range)  azithromycin (ZITHROMAX) 500 mg in sodium chloride 0.9 % 250 mL IVPB (has no administration in time  range)  sodium chloride 0.9 % bolus 1,000 mL (1,000 mLs Intravenous New Bag/Given (Non-Interop) 10/29/22 1759)  ipratropium-albuterol (DUONEB) 0.5-2.5 (3) MG/3ML nebulizer solution (  Given 10/29/22 1757)  ketorolac (TORADOL) 30 MG/ML injection 30 mg (30 mg Intravenous Given 10/29/22 1825)    ED Course/ Medical Decision Making/ A&P                             Medical Decision Making Amount and/or Complexity of Data Reviewed Labs: ordered. Radiology: ordered.  Risk Prescription drug management. Decision  regarding hospitalization.   This patient presents to the ED for concern of sob, this involves an extensive number of treatment options, and is a complaint that carries with it a high risk of complications and morbidity.  The differential diagnosis includes covid/flu/rsv, pna, chf, copd   Co morbidities that complicate the patient evaluation  HTN, schizophrenia, morbid obesity, GERD, PTSD, bipolar d/o, OSA, and tobacco abuse   Additional history obtained:  Additional history obtained from epic chart review External records from outside source obtained and reviewed including EMS report   Lab Tests:  I Ordered, and personally interpreted labs.  The pertinent results include:  wbc elevated at 14.8, bmp with cr 1.49 (new); covid/flu/rsv neg   Imaging Studies ordered:  I ordered imaging studies including cxr  I independently visualized and interpreted imaging which showed  Mild cardiomegaly with mild central vascular congestion.  2. Indeterminate left mid lung field pleural base crescentic  opacity. Repeat radiograph with better positioning of the patient is  recommended.   I agree with the radiologist interpretation   Cardiac Monitoring:  The patient was maintained on a cardiac monitor.  I personally viewed and interpreted the cardiac monitored which showed an underlying rhythm of: st   Medicines ordered and prescription drug management:  I ordered medication including  nebs and toradol  for sx  Reevaluation of the patient after these medicines showed that the patient improved I have reviewed the patients home medicines and have made adjustments as needed   Critical Interventions:  nebs   Consultations Obtained:  I requested consultation with the hospitalist (Dr. Flossie Buffy),  and discussed lab and imaging findings as well as pertinent plan - she will admit   Problem List / ED Course:  COPD exac with fever:  possible pna on cxr.  Pt started on rocephin/zithromax.  He is neg for flu/covid/rsv.  Pt has no dx of copd in the chart, but he does smoke.  Solumedrol, magnesium, and multiple nebs have helped sx.  Pt is on 2L oxygen via  for comfort.   Reevaluation:  After the interventions noted above, I reevaluated the patient and found that they have :improved   Social Determinants of Health:  Lives at home   Dispostion:  After consideration of the diagnostic results and the patients response to treatment, I feel that the patent would benefit from admission.          Final Clinical Impression(s) / ED Diagnoses Final diagnoses:  COPD exacerbation ([place])  Community acquired pneumonia, unspecified laterality    Rx / DC Orders ED Discharge Orders     None         Isla Pence, MD 10/29/22 2003

## 2022-10-29 NOTE — ED Notes (Signed)
Pt report received from previous nurse. Pt A&O x4, vitals stable, denies needs/complaints. Call bell in reach. No acute distress noted. Family @ bedside

## 2022-10-30 DIAGNOSIS — R0603 Acute respiratory distress: Secondary | ICD-10-CM

## 2022-10-30 LAB — CBC
HCT: 47.6 % (ref 39.0–52.0)
Hemoglobin: 15.6 g/dL (ref 13.0–17.0)
MCH: 29.4 pg (ref 26.0–34.0)
MCHC: 32.8 g/dL (ref 30.0–36.0)
MCV: 89.6 fL (ref 80.0–100.0)
Platelets: 237 10*3/uL (ref 150–400)
RBC: 5.31 MIL/uL (ref 4.22–5.81)
RDW: 13.3 % (ref 11.5–15.5)
WBC: 12.9 10*3/uL — ABNORMAL HIGH (ref 4.0–10.5)
nRBC: 0 % (ref 0.0–0.2)

## 2022-10-30 LAB — BASIC METABOLIC PANEL
Anion gap: 10 (ref 5–15)
BUN: 14 mg/dL (ref 6–20)
CO2: 25 mmol/L (ref 22–32)
Calcium: 9.2 mg/dL (ref 8.9–10.3)
Chloride: 103 mmol/L (ref 98–111)
Creatinine, Ser: 1.32 mg/dL — ABNORMAL HIGH (ref 0.61–1.24)
GFR, Estimated: >60 mL/min (ref >60)
Glucose, Bld: 150 mg/dL — ABNORMAL HIGH (ref 70–99)
Potassium: 4.1 mmol/L (ref 3.5–5.1)
Sodium: 138 mmol/L (ref 135–145)

## 2022-10-30 NOTE — TOC Initial Note (Signed)
Transition of Care [facility]) - Initial/Assessment Note    Patient Details  Name: Cristian Anderson MRN: [id number] Date of Birth: Dec 21, 1975  Transition of Care [facility]) CM/SW Contact:    Levonne Lapping, RN Phone Number: 10/30/2022, 4:01 PM  Clinical Narrative:       Transition of Care [facility]) Screening Note   Patient Details  Name: Cristian Anderson Date of Birth: May 29, 1976   Transition of Care [facility]) CM/SW Contact:    Levonne Lapping, RN Phone Number: 10/30/2022, 4:01 PM    Transition of Care [facility]) has reviewed patient and no TOC needs have been identified at this time.  Anticipate DC to home no needs We will continue to monitor patient advancement through interdisciplinary progression rounds. If new patient transition needs arise, please place a TOC consult.                       Patient Goals and CMS Choice            Expected Discharge Plan and Services                                              Prior Living Arrangements/Services                       Activities of Daily Living      Permission Sought/Granted                  Emotional Assessment              Admission diagnosis:  Acute respiratory distress [R06.03] COPD exacerbation ([place]) [J44.1] Community acquired pneumonia, unspecified laterality [J18.9] Patient Active Problem List   Diagnosis Date Noted   AKI (acute kidney injury) ([place]) 10/29/2022   Acute respiratory distress 10/29/2022   CAP (community acquired pneumonia) Q000111Q   Acute metabolic encephalopathy Q000111Q   Schizophrenia, unspecified type ([place]) 03/04/2022   Cigarette smoker 01/11/2020   H/O gastroesophageal reflux (GERD) 06/27/2019   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   BMI 50.0-59.9, adult ([place]) 05/04/2018   Abnormal results of liver function studies 01-06-2018   Prediabetes Jan 06, 2018   Essential hypertension 11/27/2017   Mixed hyperlipidemia 11/27/2017   Nicotine dependence,  other tobacco product, uncomplicated Q000111Q   OSA (obstructive sleep apnea) 11/27/2017   PTSD (post-traumatic stress disorder) 11/04/2014   PCP:  Gildardo Pounds, NP Pharmacy:   [id number]Phone: [phone]: [phone]    Social Determinants of Health (SDOH) Social History: SDOH Screenings   Depression (PHQ2-9): Medium Risk (06/22/2022)  Tobacco Use: High Risk (10/29/2022)   SDOH Interventions:     Readmission Risk Interventions     No data to display

## 2022-10-30 NOTE — ED Notes (Signed)
Pt pulse ox while ambulating:  Pt denied SOB, chest tightness, lightheadedness while ambulating. Lowest SaO2 was 87%, pt recovered on own.    10/30/22 1015 10/30/22 1017 10/30/22 1019  Vitals  Pulse Rate 86 99 (!) 106  ECG Heart Rate 89 100 (!) 117  Resp (!) 21 (!) 37 (!) 44  MEWS COLOR  MEWS Score Color Green Yellow Red  Oxygen Therapy  SpO2 97 % (!) 89 % 90 %    10/30/22 1021  Vitals  Pulse Rate (!) 105  ECG Heart Rate (!) 106  Resp 16  MEWS COLOR  MEWS Score Color Green  Oxygen Therapy  SpO2 95 %

## 2022-10-30 NOTE — ED Notes (Signed)
PT up to bathroom with stable gait.

## 2022-10-30 NOTE — ED Notes (Signed)
ED TO INPATIENT HANDOFF REPORT  ED Nurse Name and Phone #: Duanne Guess [phone]  S Name/Age/Gender Cristian Anderson 47 y.o. male Room/Bed: 003C/003C  Code Status   Code Status: Full Code  Home/SNF/Other Home Patient oriented to: self, place, time, and situation Is this baseline? Yes   Triage Complete: Triage complete  Chief Complaint Acute respiratory distress [R06.03]  Triage Note Pt BIBA from home. Pt c/o SHOB. EMS attempted CPAP, pt would not tolerate  Pt received 2 duonebs, 2g mag, 125 solumedrol   Allergies Allergies  Allergen Reactions   Penicillins Itching   Tylenol [Acetaminophen] Itching    Level of Care/Admitting Diagnosis ED Disposition     ED Disposition  Admit   Condition  --   [place]: [place] [100100]  Level of Care: Progressive [102]  Admit to Progressive based on following criteria: RESPIRATORY PROBLEMS hypoxemic/hypercapnic respiratory failure that is responsive to NIPPV (BiPAP) or High Flow Nasal Cannula (6-80 lpm). Frequent assessment/intervention, no > Q2 hrs < Q4 hrs, to maintain oxygenation and pulmonary hygiene.  May admit patient to [facility] or [facility] if equivalent level of care is available:: No  Covid Evaluation: Asymptomatic - no recent exposure (last 10 days) testing not required  Diagnosis: Acute respiratory distress [166502]  Admitting Physician: Orene Desanctis [id number]  Attending Physician: Orene Desanctis [id number]  Certification:: I certify this patient will need inpatient services for at least 2 midnights  Estimated Length of Stay: 2          B Medical/Surgery History Past Medical History:  Diagnosis Date   Anxiety attack    Bipolar 1 disorder ([place])    Fatty liver    GERD (gastroesophageal reflux disease)    Hypertension    Obesity    Panic attack    PTSD (post-traumatic stress disorder)    Schizophrenia ([place])    History reviewed. No pertinent surgical history.   A IV  Location/Drains/Wounds Patient Lines/Drains/Airways Status     Active Line/Drains/Airways     Name Placement date Placement time Site Days   Peripheral IV 10/29/22 18 G Left Antecubital 10/29/22  --  Antecubital  1   Peripheral IV 10/29/22 20 G Right Antecubital 10/29/22  2028  Antecubital  1            Intake/Output Last 24 hours  Intake/Output Summary (Last 24 hours) at 10/30/2022 1131 Last data filed at 10/29/2022 2217 Gross per 24 hour  Intake 1347.73 ml  Output --  Net 1347.73 ml    Labs/Imaging Results for orders placed or performed during the hospital encounter of 10/29/22 (from the past 48 hour(s))  Basic metabolic panel     Status: Abnormal   Collection Time: 10/29/22  5:55 PM  Result Value Ref Range   Sodium 138 135 - 145 mmol/L   Potassium 3.6 3.5 - 5.1 mmol/L   Chloride 99 98 - 111 mmol/L   CO2 26 22 - 32 mmol/L   Glucose, Bld 130 (H) 70 - 99 mg/dL    Comment: Glucose reference range applies only to samples taken after fasting for at least 8 hours.   BUN 10 6 - 20 mg/dL   Creatinine, Ser 1.49 (H) 0.61 - 1.24 mg/dL   Calcium 9.3 8.9 - 10.3 mg/dL   GFR, Estimated 58 (L) >60 mL/min    Comment: (NOTE) Calculated using the CKD-EPI Creatinine Equation (2021)    Anion gap 13 5 - 15    Comment: Performed at  [facility], [place] [address], [place], [place] [address]  CBC with Differential     Status: Abnormal   Collection Time: 10/29/22  5:55 PM  Result Value Ref Range   WBC 14.8 (H) 4.0 - 10.5 K/uL   RBC 5.49 4.22 - 5.81 MIL/uL   Hemoglobin 16.4 13.0 - 17.0 g/dL   HCT 49.1 39.0 - 52.0 %   MCV 89.4 80.0 - 100.0 fL   MCH 29.9 26.0 - 34.0 pg   MCHC 33.4 30.0 - 36.0 g/dL   RDW 13.2 11.5 - 15.5 %   Platelets 267 150 - 400 K/uL   nRBC 0.0 0.0 - 0.2 %   Neutrophils Relative % 49 %   Neutro Abs 7.2 1.7 - 7.7 K/uL   Lymphocytes Relative 40 %   Lymphs Abs 5.9 (H) 0.7 - 4.0 K/uL   Monocytes Relative 10 %   Monocytes Absolute 1.5 (H) 0.1 - 1.0 K/uL    Eosinophils Relative 1 %   Eosinophils Absolute 0.1 0.0 - 0.5 K/uL   Basophils Relative 0 %   Basophils Absolute 0.1 0.0 - 0.1 K/uL   Immature Granulocytes 0 %   Abs Immature Granulocytes 0.04 0.00 - 0.07 K/uL    Comment: Performed at [facility] [address], [place], [place] [address]  Brain natriuretic peptide     Status: None   Collection Time: 10/29/22  5:55 PM  Result Value Ref Range   B Natriuretic Peptide 30.8 0.0 - 100.0 pg/mL    Comment: Performed at [facility] [address], [place], [place] [address]  Resp panel by RT-PCR (RSV, Flu A&B, Covid) Anterior Nasal Swab     Status: None   Collection Time: 10/29/22  5:55 PM   Specimen: Anterior Nasal Swab  Result Value Ref Range   SARS Coronavirus 2 by RT PCR NEGATIVE NEGATIVE   Influenza A by PCR NEGATIVE NEGATIVE   Influenza B by PCR NEGATIVE NEGATIVE    Comment: (NOTE) The Xpert Xpress SARS-CoV-2/FLU/RSV plus assay is intended as an aid in the diagnosis of influenza from Nasopharyngeal swab specimens and should not be used as a sole basis for treatment. Nasal washings and aspirates are unacceptable for Xpert Xpress SARS-CoV-2/FLU/RSV testing.  Fact Sheet for Patients: EntrepreneurPulse.com.au  Fact Sheet for Healthcare Providers: IncredibleEmployment.be  This test is not yet approved or cleared by the [place] FDA and has been authorized for detection and/or diagnosis of SARS-CoV-2 by FDA under an Emergency Use Authorization (EUA). This EUA will remain in effect (meaning this test can be used) for the duration of the COVID-19 declaration under Section 564(b)(1) of the Act, 21 U.S.C. section 360bbb-3(b)(1), unless the authorization is terminated or revoked.     Resp Syncytial Virus by PCR NEGATIVE NEGATIVE    Comment: (NOTE) Fact Sheet for Patients: EntrepreneurPulse.com.au  Fact Sheet for Healthcare  Providers: IncredibleEmployment.be  This test is not yet approved or cleared by the [place] FDA and has been authorized for detection and/or diagnosis of SARS-CoV-2 by FDA under an Emergency Use Authorization (EUA). This EUA will remain in effect (meaning this test can be used) for the duration of the COVID-19 declaration under Section 564(b)(1) of the Act, 21 U.S.C. section 360bbb-3(b)(1), unless the authorization is terminated or revoked.  Performed at [facility], [place] [address] [place], [place] [address]   I-Stat venous blood gas, ED     Status: Abnormal   Collection Time: 10/29/22  8:37 PM  Result Value Ref Range   pH, Ven 7.397 7.25 - 7.43   pCO2, Ven 46.1 44 - 60 mmHg   pO2, Ven 63 (H) 32 - 45 mmHg   Bicarbonate 28.3 (H) 20.0 - 28.0 mmol/L   TCO2 30 22 - 32 mmol/L   O2 Saturation 92 %   Acid-Base Excess 3.0 (H) 0.0 - 2.0 mmol/L   Sodium 139 135 - 145 mmol/L   Potassium 3.9 3.5 - 5.1 mmol/L   Calcium, Ion 1.17 1.15 - 1.40 mmol/L   HCT 47.0 39.0 - 52.0 %   Hemoglobin 16.0 13.0 - 17.0 g/dL   Sample type VENOUS   CBC     Status: Abnormal   Collection Time: 10/30/22  1:12 AM  Result Value Ref Range   WBC 12.9 (H) 4.0 - 10.5 K/uL   RBC 5.31 4.22 - 5.81 MIL/uL   Hemoglobin 15.6 13.0 - 17.0 g/dL   HCT 47.6 39.0 - 52.0 %   MCV 89.6 80.0 - 100.0 fL   MCH 29.4 26.0 - 34.0 pg   MCHC 32.8 30.0 - 36.0 g/dL   RDW 13.3 11.5 - 15.5 %   Platelets 237 150 - 400 K/uL   nRBC 0.0 0.0 - 0.2 %    Comment: Performed at [facility], [place] [address], [place], [place] [id number]  Basic metabolic panel     Status: Abnormal   Collection Time: 10/30/22  1:12 AM  Result Value Ref Range   Sodium 138 135 - 145 mmol/L   Potassium 4.1 3.5 - 5.1 mmol/L   Chloride 103 98 - 111 mmol/L   CO2 25 22 - 32 mmol/L   Glucose, Bld 150 (H) 70 - 99 mg/dL    Comment: Glucose reference range applies only to samples taken after fasting for at least 8 hours.   BUN 14 6 -  20 mg/dL   Creatinine, Ser 1.32 (H) 0.61 - 1.24 mg/dL   Calcium 9.2 8.9 - 10.3 mg/dL   GFR, Estimated >60 >60 mL/min    Comment: (NOTE) Calculated using the CKD-EPI Creatinine Equation (2021)    Anion gap 10 5 - 15    Comment: Performed at [facility] [address] [place], [place] [address]   DG Chest Port 1 View  Result Date: 10/29/2022 CLINICAL DATA:  Shortness of breath. EXAM: PORTABLE CHEST 1 VIEW COMPARISON:  Chest radiograph dated 09/14/2016. FINDINGS: Evaluation is limited due to body habitus and patient's positioning. Mild cardiomegaly with mild central vascular congestion. Left mid lung field pleural base crescentic opacity is not evaluated but may represent an artifact related to skin fold. A loculated effusion or other etiologies are not excluded. Repeat radiograph with better positioning of the patient is recommended for better evaluation. No pneumothorax. No acute osseous pathology. IMPRESSION: 1. Mild cardiomegaly with mild central vascular congestion. 2. Indeterminate left mid lung field pleural base crescentic opacity. Repeat radiograph with better positioning of the patient is recommended. Electronically Signed   By: Anner Crete M.D.   On: 10/29/2022 18:41    Pending Labs Unresulted Labs (From admission, onward)     Start     Ordered   10/29/22 2012  Blood gas, venous  ONCE - STAT,   STAT        10/29/22 2011            Vitals/Pain Today's Vitals   10/30/22 1017 10/30/22 1019 10/30/22 1021 10/30/22 1040  BP:    (!) 184/108  Pulse:  99 (!) 106 (!) 105 93  Resp: (!) 37 (!) 44 16 (!) 33  Temp:      TempSrc:      SpO2: (!) 89% 90% 95% 95%  Weight:      Height:      PainSc:        Isolation Precautions No active isolations  Medications Medications  ipratropium-albuterol (DUONEB) 0.5-2.5 (3) MG/3ML nebulizer solution 3 mL (0 mLs Nebulization Hold 10/29/22 1757)  ibuprofen (ADVIL) tablet 800 mg (800 mg Oral Patient Refused/Not Given 10/29/22 1757)   albuterol (PROVENTIL) (2.5 MG/3ML) 0.083% nebulizer solution 5 mg (5 mg Nebulization Not Given 10/29/22 2027)  enoxaparin (LOVENOX) injection 80 mg (80 mg Subcutaneous Given 10/29/22 2115)  cefTRIAXone (ROCEPHIN) 2 g in sodium chloride 0.9 % 100 mL IVPB (has no administration in time range)  azithromycin (ZITHROMAX) 500 mg in sodium chloride 0.9 % 250 mL IVPB (0 mg Intravenous Stopped 10/29/22 2217)  amLODipine (NORVASC) tablet 10 mg (10 mg Oral Given 10/30/22 0900)  carvedilol (COREG) tablet 12.5 mg (12.5 mg Oral Given 10/30/22 0900)  irbesartan (AVAPRO) tablet 300 mg (300 mg Oral Given 10/30/22 0900)    And  hydrochlorothiazide (HYDRODIURIL) tablet 25 mg (25 mg Oral Given 10/30/22 0900)  sodium chloride 0.9 % bolus 1,000 mL (0 mLs Intravenous Stopped 10/29/22 2112)  ipratropium-albuterol (DUONEB) 0.5-2.5 (3) MG/3ML nebulizer solution (  Given 10/29/22 1757)  ketorolac (TORADOL) 30 MG/ML injection 30 mg (30 mg Intravenous Given 10/29/22 1825)  cefTRIAXone (ROCEPHIN) 1 g in sodium chloride 0.9 % 100 mL IVPB (0 g Intravenous Stopped 10/29/22 2111)    Mobility walks     Focused Assessments Pulmonary Assessment Handoff:  Lung sounds: Bilateral Breath Sounds: Diminished L Breath Sounds: Expiratory wheezes, Diminished R Breath Sounds: Diminished, Expiratory wheezes O2 Device: Room Air O2 Flow Rate (L/min): 2 L/min    R Recommendations: See Admitting Provider Note  Report given to:   Additional Notes: room air

## 2022-10-30 NOTE — ED Notes (Signed)
Provider at bedside

## 2022-10-30 NOTE — Progress Notes (Signed)
PROGRESS NOTE    Cristian Anderson  [id number] DOB: September 15, 1975 DOA: 10/29/2022 PCP: Gildardo Pounds, NP   Brief Narrative:  Cristian Anderson is a 47 y.o. male with medical history significant of hypertension, OSA with CPAP, morbid obesity, bipolar 1 disorder, PTSD, schizophrenia, anxiety who presents with increasing shortness of breath.  Family at bedside denies any recent travel sick contacts or other illnesses.  Patient continues to smoke.  Assessment & Plan:   Principal Problem:   Acute respiratory distress Active Problems:   Essential hypertension   BMI 50.0-59.9, adult (HCC)   OSA (obstructive sleep apnea)   AKI (acute kidney injury) ([place])   CAP (community acquired pneumonia)   Acute metabolic encephalopathy   Acute hypoxic respiratory distress secondary to CAP, POA -Patient does technically meet sepsis criteria (POA) given tachycardia and tachypnea in the setting of infection however he does not display any overt symptoms of disseminated or profound infection -Ambulating in the room today patient desats to the low 0000000 high [id number] -Complicated by obesity hypoventilation syndrome -CXR with left mid-lung opacity and is febrile concerning for pneumonia -Negative flu/COVID/RSV PCR -Continue IV Rocephin and azithromycin for coverage of community-acquired pneumonia   Acute metabolic encephalopathy, resolved -CO2 minimally elevated - continue to follow clincally   AKI (acute kidney injury) ([place]) -creatinine 1.5 at intake (baseline <1) -Improving with IVF and increased oral intake   OSA (obstructive sleep apnea) Normally wears CPAP at night but refusing to wear here or with EMS en route   BMI 50.0-59.9, adult (HCC) BMI of 53, morbid obesity   Essential hypertension -continue home amlodipine, Coreg, valsartan-HCTZ  DVT prophylaxis: Lovenox Code Status: Full Family Communication: At bedside  Status is: Inpatient  Dispo: The patient is from: Home              Anticipated d/c is  to: Home              Anticipated d/c date is: 24 to 48 hours              Patient currently not medically stable for discharge given ongoing hypoxia and symptoms  Consultants:  None  Procedures:  None  Antimicrobials:  Azithromycin, ceftriaxone x 5 days -last dose 11/02/2022  Subjective: No acute issues or events overnight, respiratory status improving but not yet back to baseline.  Patient remains markedly dyspneic with even minimal exertion around the room.  Otherwise denies nausea vomiting diarrhea constipation headache fevers chills or chest pain.  Objective: Vitals:   10/30/22 0145 10/30/22 0150 10/30/22 0400 10/30/22 0600  BP: (!) 157/95  (!) 145/77 125/89  Pulse: 88  77 85  Resp: '17 19  19  '$ Temp: 97.9 F (36.6 C)   97.9 F (36.6 C)  TempSrc: Oral   Oral  SpO2: 95% 98% 100% 100%  Weight:      Height:        Intake/Output Summary (Last 24 hours) at 10/30/2022 0751 Last data filed at 10/29/2022 2217 Gross per 24 hour  Intake 1347.73 ml  Output --  Net 1347.73 ml   Filed Weights   10/29/22 1753  Weight: (!) 158.8 kg    Examination:  General:  Pleasantly resting in bed, No acute distress.  Morbidly obese. HEENT:  Normocephalic atraumatic.  Sclerae nonicteric, noninjected.  Extraocular movements intact bilaterally. Neck:  Without mass or deformity.  Trachea is midline. Lungs: Tachypneic without overt wheeze rales or rhonchi. Heart:  Regular rate and rhythm.  Without murmurs, rubs, or gallops. Abdomen:  Soft, nontender, nondistended.  Without guarding or rebound. Extremities: Without cyanosis, clubbing, edema, or obvious deformity. Vascular:  Dorsalis pedis and posterior tibial pulses palpable bilaterally. Skin:  Warm and dry, no erythema, no ulcerations.  Data Reviewed: I have personally reviewed following labs and imaging studies  CBC: Recent Labs  Lab 10/29/22 1755 10/29/22 2037 10/30/22 0112  WBC 14.8*  --  12.9*  NEUTROABS 7.2  --   --   HGB 16.4 16.0  15.6  HCT 49.1 47.0 47.6  MCV 89.4  --  89.6  PLT 267  --  [id number]   Basic Metabolic Panel: Recent Labs  Lab 10/29/22 1755 10/29/22 2037 10/30/22 0112  NA 138 139 138  K 3.6 3.9 4.1  CL 99  --  103  CO2 26  --  25  GLUCOSE 130*  --  150*  BUN 10  --  14  CREATININE 1.49*  --  1.32*  CALCIUM 9.3  --  9.2   GFR: Estimated Creatinine Clearance: 102.4 mL/min (A) (by C-G formula based on SCr of 1.32 mg/dL (H)).  Recent Results (from the past 240 hour(s))  Resp panel by RT-PCR (RSV, Flu A&B, Covid) Anterior Nasal Swab     Status: None   Collection Time: 10/29/22  5:55 PM   Specimen: Anterior Nasal Swab  Result Value Ref Range Status   SARS Coronavirus 2 by RT PCR NEGATIVE NEGATIVE Final   Influenza A by PCR NEGATIVE NEGATIVE Final   Influenza B by PCR NEGATIVE NEGATIVE Final    Comment: (NOTE) The Xpert Xpress SARS-CoV-2/FLU/RSV plus assay is intended as an aid in the diagnosis of influenza from Nasopharyngeal swab specimens and should not be used as a sole basis for treatment. Nasal washings and aspirates are unacceptable for Xpert Xpress SARS-CoV-2/FLU/RSV testing.  Fact Sheet for Patients: EntrepreneurPulse.com.au  Fact Sheet for Healthcare Providers: IncredibleEmployment.be  This test is not yet approved or cleared by the [place] FDA and has been authorized for detection and/or diagnosis of SARS-CoV-2 by FDA under an Emergency Use Authorization (EUA). This EUA will remain in effect (meaning this test can be used) for the duration of the COVID-19 declaration under Section 564(b)(1) of the Act, 21 U.S.C. section 360bbb-3(b)(1), unless the authorization is terminated or revoked.     Resp Syncytial Virus by PCR NEGATIVE NEGATIVE Final    Comment: (NOTE) Fact Sheet for Patients: EntrepreneurPulse.com.au  Fact Sheet for Healthcare Providers: IncredibleEmployment.be  This test is not yet  approved or cleared by the [place] FDA and has been authorized for detection and/or diagnosis of SARS-CoV-2 by FDA under an Emergency Use Authorization (EUA). This EUA will remain in effect (meaning this test can be used) for the duration of the COVID-19 declaration under Section 564(b)(1) of the Act, 21 U.S.C. section 360bbb-3(b)(1), unless the authorization is terminated or revoked.  Performed at [facility], [place] [address] [place], [place] [address]     Radiology Studies: DG Chest Port 1 View  Result Date: 10/29/2022 CLINICAL DATA:  Shortness of breath. EXAM: PORTABLE CHEST 1 VIEW COMPARISON:  Chest radiograph dated 09/14/2016. FINDINGS: Evaluation is limited due to body habitus and patient's positioning. Mild cardiomegaly with mild central vascular congestion. Left mid lung field pleural base crescentic opacity is not evaluated but may represent an artifact related to skin fold. A loculated effusion or other etiologies are not excluded. Repeat radiograph with better positioning of the patient is recommended for better evaluation. No pneumothorax. No acute osseous pathology. IMPRESSION:  1. Mild cardiomegaly with mild central vascular congestion. 2. Indeterminate left mid lung field pleural base crescentic opacity. Repeat radiograph with better positioning of the patient is recommended. Electronically Signed   By: Anner Crete M.D.   On: 10/29/2022 18:41    Scheduled Meds:  albuterol  5 mg Nebulization Once   amLODipine  10 mg Oral Daily   carvedilol  12.5 mg Oral BID WC   enoxaparin (LOVENOX) injection  80 mg Subcutaneous Q24H   irbesartan  300 mg Oral Daily   And   hydrochlorothiazide  25 mg Oral Daily   ibuprofen  800 mg Oral Once   ipratropium-albuterol  3 mL Nebulization Once   Continuous Infusions:  azithromycin Stopped (10/29/22 2217)   cefTRIAXone (ROCEPHIN)  IV       LOS: 1 day   Time spent: 70mn  Timberly Yott C Bud Kaeser, DO [facility]  If  7PM-7AM, please contact night-coverage [email or web address]  10/30/2022, 7:51 AM

## 2022-10-31 MED ORDER — IPRATROPIUM-ALBUTEROL 0.5-2.5 (3) MG/3ML IN SOLN: 3.0000 mL | RESPIRATORY_TRACT | Status: DC | PRN

## 2022-10-31 MED ORDER — FLUTICASONE FUROATE-VILANTEROL 100-25 MCG/ACT IN AEPB
1.0000 | INHALATION_SPRAY | Freq: Every day | RESPIRATORY_TRACT | Status: DC
  Administered 2022-10-31: 1 via RESPIRATORY_TRACT
  Filled 2022-10-31: qty 28

## 2022-10-31 MED ORDER — AZITHROMYCIN 500 MG PO TABS
500.0000 mg | ORAL_TABLET | Freq: Every day | ORAL | Status: DC
  Administered 2022-10-31 – 2022-11-01 (×2): 500 mg via ORAL
  Filled 2022-10-31 (×2): qty 1

## 2022-10-31 NOTE — Progress Notes (Signed)
Notified of HR pauses per CCMD ranging from 2-9 sec.  Dr. Myna Hidalgo, hospitalist, notified.  Pt also desating into high 70s.  He usually wears CPAP at home.  Pt placed on [place] 6L until CPAP can be obtained

## 2022-10-31 NOTE — Progress Notes (Signed)
RT placed patient on CPAP HS. 6L O2 bleed in needed. Patient tolerating well at this time. 

## 2022-10-31 NOTE — Progress Notes (Signed)
SATURATION QUALIFICATIONS: (This note is used to comply with regulatory documentation for home oxygen)  Patient Saturations on Room Air at Rest = 92%  Patient Saturations on Room Air while Ambulating = 85%  Patient Saturations on 2 Liters of oxygen while Ambulating = 92%  Please briefly explain why patient needs home oxygen: Patient desats upon ambulation.

## 2022-10-31 NOTE — Progress Notes (Signed)
PROGRESS NOTE    Sayyid Trnka  [id number] DOB: [birth year] DOA: 10/29/2022 PCP: Gildardo Pounds, NP   Brief Narrative:  Cristian Anderson is a 47 y.o. male with medical history significant of hypertension, OSA with CPAP, morbid obesity, bipolar 1 disorder, PTSD, schizophrenia, anxiety who presents with increasing shortness of breath.  Family at bedside denies any recent travel sick contacts or other illnesses.  Patient continues to smoke.  Assessment & Plan:   Principal Problem:   Acute respiratory distress Active Problems:   Essential hypertension   BMI 50.0-59.9, adult (HCC)   OSA (obstructive sleep apnea)   AKI (acute kidney injury) ([place])   CAP (community acquired pneumonia)   Acute metabolic encephalopathy   Acute hypoxic respiratory distress secondary to CAP, POA -Patient does technically meet sepsis criteria (POA) given tachycardia and tachypnea in the setting of infection however he does not display any overt symptoms of disseminated or profound infection -Ambulating sats remain mid 80s on room air -Complicated by obesity hypoventilation syndrome -CXR with left mid-lung opacity and is febrile concerning for pneumonia -Negative flu/COVID/RSV PCR -Continue IV Rocephin and azithromycin for coverage of community-acquired pneumonia   Acute metabolic encephalopathy, resolved -CO2 minimally elevated - continue to follow clincally   AKI (acute kidney injury) ([place]) -creatinine 1.5 at intake (baseline <1) -Improving with IVF and increased oral intake   OSA (obstructive sleep apnea) Normally wears CPAP at night but refusing to wear here or with EMS en route   BMI 50.0-59.9, adult (HCC) BMI of 53, morbid obesity   Essential hypertension -continue home amlodipine, Coreg, valsartan-HCTZ  DVT prophylaxis: Lovenox Code Status: Full Family Communication: At bedside  Status is: Inpatient  Dispo: The patient is from: Home              Anticipated d/c is to: Home               Anticipated d/c date is: 24 to 48 hours              Patient currently not medically stable for discharge given ongoing hypoxia and symptoms  Consultants:  None  Procedures:  None  Antimicrobials:  Azithromycin, ceftriaxone x 5 days -last dose 11/02/2022  Subjective: No acute issues or events overnight, respiratory status improving but not yet back to baseline.  Patient reports ongoing dyspnea with exertion but markedly improving from admission.  Remains hypoxic with exertion  Objective: Vitals:   10/30/22 1900 10/31/22 0000 10/31/22 0400 10/31/22 0500  BP: (!) 149/79 (!) 127/96  123/88  Pulse: 74 62  66  Resp: 18 (!) 23  17  Temp: 98.3 F (36.8 C) 98.2 F (36.8 C)  98.4 F (36.9 C)  TempSrc: Oral Oral  Oral  SpO2: 96% 93% 92% 92%  Weight:      Height:        Intake/Output Summary (Last 24 hours) at 10/31/2022 0748 Last data filed at 10/31/2022 0200 Gross per 24 hour  Intake 725.42 ml  Output --  Net 725.42 ml    Filed Weights   10/29/22 1753  Weight: (!) 158.8 kg    Examination:  General:  Pleasantly resting in bed, No acute distress.  Morbidly obese. HEENT:  Normocephalic atraumatic.  Sclerae nonicteric, noninjected.  Extraocular movements intact bilaterally. Neck:  Without mass or deformity.  Trachea is midline. Lungs: Tachypneic without overt wheeze rales or rhonchi. Heart:  Regular rate and rhythm.  Without murmurs, rubs, or gallops. Abdomen:  Soft, nontender, nondistended.  Without guarding or  rebound. Extremities: Without cyanosis, clubbing, edema, or obvious deformity. Vascular:  Dorsalis pedis and posterior tibial pulses palpable bilaterally. Skin:  Warm and dry, no erythema, no ulcerations.  Data Reviewed: I have personally reviewed following labs and imaging studies  CBC: Recent Labs  Lab 10/29/22 1755 10/29/22 2037 10/30/22 0112  WBC 14.8*  --  12.9*  NEUTROABS 7.2  --   --   HGB 16.4 16.0 15.6  HCT 49.1 47.0 47.6  MCV 89.4  --  89.6  PLT  267  --  [id number]    Basic Metabolic Panel: Recent Labs  Lab 10/29/22 1755 10/29/22 2037 10/30/22 0112  NA 138 139 138  K 3.6 3.9 4.1  CL 99  --  103  CO2 26  --  25  GLUCOSE 130*  --  150*  BUN 10  --  14  CREATININE 1.49*  --  1.32*  CALCIUM 9.3  --  9.2    GFR: Estimated Creatinine Clearance: 102.4 mL/min (A) (by C-G formula based on SCr of 1.32 mg/dL (H)).  Recent Results (from the past 240 hour(s))  Resp panel by RT-PCR (RSV, Flu A&B, Covid) Anterior Nasal Swab     Status: None   Collection Time: 10/29/22  5:55 PM   Specimen: Anterior Nasal Swab  Result Value Ref Range Status   SARS Coronavirus 2 by RT PCR NEGATIVE NEGATIVE Final   Influenza A by PCR NEGATIVE NEGATIVE Final   Influenza B by PCR NEGATIVE NEGATIVE Final    Comment: (NOTE) The Xpert Xpress SARS-CoV-2/FLU/RSV plus assay is intended as an aid in the diagnosis of influenza from Nasopharyngeal swab specimens and should not be used as a sole basis for treatment. Nasal washings and aspirates are unacceptable for Xpert Xpress SARS-CoV-2/FLU/RSV testing.  Fact Sheet for Patients: EntrepreneurPulse.com.au  Fact Sheet for Healthcare Providers: IncredibleEmployment.be  This test is not yet approved or cleared by the [place] FDA and has been authorized for detection and/or diagnosis of SARS-CoV-2 by FDA under an Emergency Use Authorization (EUA). This EUA will remain in effect (meaning this test can be used) for the duration of the COVID-19 declaration under Section 564(b)(1) of the Act, 21 U.S.C. section 360bbb-3(b)(1), unless the authorization is terminated or revoked.     Resp Syncytial Virus by PCR NEGATIVE NEGATIVE Final    Comment: (NOTE) Fact Sheet for Patients: EntrepreneurPulse.com.au  Fact Sheet for Healthcare Providers: IncredibleEmployment.be  This test is not yet approved or cleared by the [place] FDA and has  been authorized for detection and/or diagnosis of SARS-CoV-2 by FDA under an Emergency Use Authorization (EUA). This EUA will remain in effect (meaning this test can be used) for the duration of the COVID-19 declaration under Section 564(b)(1) of the Act, 21 U.S.C. section 360bbb-3(b)(1), unless the authorization is terminated or revoked.  Performed at [facility], [place] [address] [place], [place] [address]     Radiology Studies: DG Chest Port 1 View  Result Date: 10/29/2022 CLINICAL DATA:  Shortness of breath. EXAM: PORTABLE CHEST 1 VIEW COMPARISON:  Chest radiograph dated 09/14/2016. FINDINGS: Evaluation is limited due to body habitus and patient's positioning. Mild cardiomegaly with mild central vascular congestion. Left mid lung field pleural base crescentic opacity is not evaluated but may represent an artifact related to skin fold. A loculated effusion or other etiologies are not excluded. Repeat radiograph with better positioning of the patient is recommended for better evaluation. No pneumothorax. No acute osseous pathology. IMPRESSION: 1. Mild cardiomegaly with mild  central vascular congestion. 2. Indeterminate left mid lung field pleural base crescentic opacity. Repeat radiograph with better positioning of the patient is recommended. Electronically Signed   By: Anner Crete M.D.   On: 10/29/2022 18:41    Scheduled Meds:  albuterol  5 mg Nebulization Once   amLODipine  10 mg Oral Daily   carvedilol  12.5 mg Oral BID WC   enoxaparin (LOVENOX) injection  80 mg Subcutaneous Q24H   irbesartan  300 mg Oral Daily   And   hydrochlorothiazide  25 mg Oral Daily   ibuprofen  800 mg Oral Once   ipratropium-albuterol  3 mL Nebulization Once   Continuous Infusions:  azithromycin Stopped (10/30/22 2315)   cefTRIAXone (ROCEPHIN)  IV Stopped (10/30/22 2104)     LOS: 2 days   Time spent: 46mn  Indiana Gamero C Izaiha Lo, DO [facility]  If 7PM-7AM, please contact  night-coverage [email or web address]  10/31/2022, 7:48 AM

## 2022-10-31 NOTE — Plan of Care (Signed)

## 2022-11-01 ENCOUNTER — Other Ambulatory Visit: Payer: Self-pay | Admitting: Nurse Practitioner

## 2022-11-01 DIAGNOSIS — I10 Essential (primary) hypertension: Secondary | ICD-10-CM

## 2022-11-01 MED ORDER — FLUTICASONE FUROATE-VILANTEROL 100-25 MCG/ACT IN AEPB: 1.0000 | INHALATION_SPRAY | Freq: Every day | RESPIRATORY_TRACT | 0 refills | Status: DC

## 2022-11-01 MED ORDER — AZITHROMYCIN 500 MG PO TABS: 500.0000 mg | ORAL_TABLET | Freq: Every day | ORAL | 0 refills | Status: AC

## 2022-11-01 MED ORDER — ALBUTEROL SULFATE HFA 108 (90 BASE) MCG/ACT IN AERS: 2.0000 | INHALATION_SPRAY | Freq: Four times a day (QID) | RESPIRATORY_TRACT | 2 refills | Status: DC | PRN

## 2022-11-01 NOTE — Progress Notes (Signed)
SATURATION QUALIFICATIONS: (This note is used to comply with regulatory documentation for home oxygen)  Patient Saturations on Room Air at Rest = 97%  Patient Saturations on Room Air while Ambulating = 96%    Please briefly explain why patient needs home oxygen: Patient does not need oxygen.

## 2022-11-01 NOTE — Progress Notes (Signed)
Discharge teaching complete. Meds, diet, activity, follow up appointments reviewed and all questions answered. Copy of instructions given to patient and prescriptions sent to pharmacy. Patient discharged home via wheelchair with mother.

## 2022-11-01 NOTE — Discharge Summary (Signed)
Physician Discharge Summary  Cristian Anderson [id number] DOB: 1976-02-24 DOA: 10/29/2022  PCP: Gildardo Pounds, NP  Admit date: 10/29/2022 Discharge date: 11/01/2022  Admitted From: Home Disposition:  Home  Recommendations for Outpatient Follow-up:  Follow up with PCP in 1-2 weeks Consider seeing a pulmonologist in the next few weeks if necessary  Discharge Condition:Stable  CODE STATUS:Full  Diet recommendation: Low salt low fat diet    Brief/Interim Summary: Cristian Anderson is a 47 y.o. male with medical history significant of hypertension, OSA with CPAP, morbid obesity, bipolar 1 disorder, PTSD, schizophrenia, anxiety who presents with increasing shortness of breath.  Family at bedside denies any recent travel sick contacts or other illnesses.  Patient continues to smoke.   Patient admitted for acute hypoxia in the setting of CAP, and possibly concurrent COPD exacerbation. Patient's mother reports he had previously been diagnosed with COPD which was not previously reported to our system. Patient improved on antibiotics, breo, and nebs. Sats now well into the mid 90s while ambulating on room air. Patient no longer has any symptoms of dyspnea. Close follow up with PCP in the next week to two weeks. Might benefit from pulmonology evaluation.  Discharge Diagnoses:  Principal Problem:   Acute respiratory distress Active Problems:   Essential hypertension   BMI 50.0-59.9, adult (HCC)   OSA (obstructive sleep apnea)   AKI (acute kidney injury) ([place])   CAP (community acquired pneumonia)   Acute metabolic encephalopathy  Acute hypoxic respiratory distress secondary to CAP, POA Presumed COPD exacerbation (previous diagnosis not admitted to at admission) -Patient does technically meet sepsis criteria (POA) given tachycardia and tachypnea in the setting of infection however he does not display any overt symptoms of disseminated or profound infection -Ambulating sats 0000000 today -Complicated by  obesity hypoventilation syndrome and likely COPD given new information about medical history during this hospitalization -CXR with left mid-lung opacity and is febrile concerning for pneumonia -Negative flu/COVID/RSV PCR -Continue remainder of azithromycin at discharge   Acute metabolic encephalopathy, resolved -CO2 minimally elevated - continue to follow clincally   AKI (acute kidney injury) ([place]) -creatinine 1.5 at intake (baseline <1) -Improving with IVF and increased oral intake   OSA (obstructive sleep apnea) Continue CPAP   BMI 50.0-59.9, adult (HCC) BMI of 53, morbid obesity   Essential hypertension -continue home amlodipine, Coreg, valsartan-HCTZ  Discharge Instructions   Allergies as of 11/01/2022       Reactions   Penicillins Itching   Tylenol [acetaminophen] Itching        Medication List     TAKE these medications    albuterol 108 (90 Base) MCG/ACT inhaler Commonly known as: VENTOLIN HFA Inhale 2 puffs into the lungs every 6 (six) hours as needed for wheezing or shortness of breath.   amLODipine 10 MG tablet Commonly known as: NORVASC Take 1 tablet (10 mg total) by mouth daily.   azithromycin 500 MG tablet Commonly known as: ZITHROMAX Take 1 tablet (500 mg total) by mouth daily for 2 days. Start taking on: November 02, 2022   Blood Pressure Monitor Devi Please provide patient with insurance approved blood pressure . Device broken.   carvedilol 12.5 MG tablet Commonly known as: COREG Take 1 tablet (12.5 mg total) by mouth 2 (two) times daily with a meal.   FLUoxetine 20 MG capsule Commonly known as: PROZAC Take 20 mg by mouth 4 (four) times daily.   fluticasone furoate-vilanterol 100-25 MCG/ACT Aepb Commonly known as: BREO ELLIPTA Inhale 1 puff into the lungs  daily for 28 doses.   rosuvastatin 40 MG tablet Commonly known as: CRESTOR Take 1 tablet (40 mg total) by mouth daily.   valsartan-hydrochlorothiazide 320-25 MG tablet Commonly known  as: DIOVAN-HCT Take 1 tablet by mouth daily.        Allergies  Allergen Reactions   Penicillins Itching   Tylenol [Acetaminophen] Itching    Consultations: None   Procedures/Studies: DG Chest Port 1 View  Result Date: 10/29/2022 CLINICAL DATA:  Shortness of breath. EXAM: PORTABLE CHEST 1 VIEW COMPARISON:  Chest radiograph dated 09/14/2016. FINDINGS: Evaluation is limited due to body habitus and patient's positioning. Mild cardiomegaly with mild central vascular congestion. Left mid lung field pleural base crescentic opacity is not evaluated but may represent an artifact related to skin fold. A loculated effusion or other etiologies are not excluded. Repeat radiograph with better positioning of the patient is recommended for better evaluation. No pneumothorax. No acute osseous pathology. IMPRESSION: 1. Mild cardiomegaly with mild central vascular congestion. 2. Indeterminate left mid lung field pleural base crescentic opacity. Repeat radiograph with better positioning of the patient is recommended. Electronically Signed   By: Anner Crete M.D.   On: 10/29/2022 18:41     Subjective: No acute issues/events overnight -respiratory status back to baseline.   Discharge Exam: Vitals:   10/31/22 2300 11/01/22 0400  BP: 138/87 124/83  Pulse: 64 (!) 57  Resp: 18   Temp: (!) 97.5 F (36.4 C) 97.7 F (36.5 C)  SpO2: 94% 92%   Vitals:   10/31/22 1933 10/31/22 2234 10/31/22 2300 11/01/22 0400  BP: (!) 149/98  138/87 124/83  Pulse: 75 (!) 56 64 (!) 57  Resp: '11 15 18   '$ Temp: 98 F (36.7 C)  (!) 97.5 F (36.4 C) 97.7 F (36.5 C)  TempSrc: Oral  Oral Axillary  SpO2: 95% 93% 94% 92%  Weight:      Height:        General: Pt is morbidly obese; alert, awake, not in acute distress Cardiovascular: RRR, S1/S2 +, no rubs, no gallops Respiratory: CTA bilaterally, no wheezing, no rhonchi Abdominal: Soft, NT, ND, bowel sounds + Extremities: no edema, no cyanosis    The results of  significant diagnostics from this hospitalization (including imaging, microbiology, ancillary and laboratory) are listed below for reference.     Microbiology: Recent Results (from the past 240 hour(s))  Resp panel by RT-PCR (RSV, Flu A&B, Covid) Anterior Nasal Swab     Status: None   Collection Time: 10/29/22  5:55 PM   Specimen: Anterior Nasal Swab  Result Value Ref Range Status   SARS Coronavirus 2 by RT PCR NEGATIVE NEGATIVE Final   Influenza A by PCR NEGATIVE NEGATIVE Final   Influenza B by PCR NEGATIVE NEGATIVE Final    Comment: (NOTE) The Xpert Xpress SARS-CoV-2/FLU/RSV plus assay is intended as an aid in the diagnosis of influenza from Nasopharyngeal swab specimens and should not be used as a sole basis for treatment. Nasal washings and aspirates are unacceptable for Xpert Xpress SARS-CoV-2/FLU/RSV testing.  Fact Sheet for Patients: EntrepreneurPulse.com.au  Fact Sheet for Healthcare Providers: IncredibleEmployment.be  This test is not yet approved or cleared by the [place] FDA and has been authorized for detection and/or diagnosis of SARS-CoV-2 by FDA under an Emergency Use Authorization (EUA). This EUA will remain in effect (meaning this test can be used) for the duration of the COVID-19 declaration under Section 564(b)(1) of the Act, 21 U.S.C. section 360bbb-3(b)(1), unless the authorization is terminated or  revoked.     Resp Syncytial Virus by PCR NEGATIVE NEGATIVE Final    Comment: (NOTE) Fact Sheet for Patients: EntrepreneurPulse.com.au  Fact Sheet for Healthcare Providers: IncredibleEmployment.be  This test is not yet approved or cleared by the [place] FDA and has been authorized for detection and/or diagnosis of SARS-CoV-2 by FDA under an Emergency Use Authorization (EUA). This EUA will remain in effect (meaning this test can be used) for the duration of the COVID-19  declaration under Section 564(b)(1) of the Act, 21 U.S.C. section 360bbb-3(b)(1), unless the authorization is terminated or revoked.  Performed at [facility], [place] [address] [place], [place] [address]      Labs: BNP (last 3 results) Recent Labs    10/29/22 1755  BNP [id number]   Basic Metabolic Panel: Recent Labs  Lab 10/29/22 1755 10/29/22 2037 10/30/22 0112  NA 138 139 138  K 3.6 3.9 4.1  CL 99  --  103  CO2 26  --  25  GLUCOSE 130*  --  150*  BUN 10  --  14  CREATININE 1.49*  --  1.32*  CALCIUM 9.3  --  9.2   Liver Function Tests: No results for input(s): "AST", "ALT", "ALKPHOS", "BILITOT", "PROT", "ALBUMIN" in the last 168 hours. No results for input(s): "LIPASE", "AMYLASE" in the last 168 hours. No results for input(s): "AMMONIA" in the last 168 hours. CBC: Recent Labs  Lab 10/29/22 1755 10/29/22 2037 10/30/22 0112  WBC 14.8*  --  12.9*  NEUTROABS 7.2  --   --   HGB 16.4 16.0 15.6  HCT 49.1 47.0 47.6  MCV 89.4  --  89.6  PLT 267  --  237   Cardiac Enzymes: No results for input(s): "CKTOTAL", "CKMB", "CKMBINDEX", "TROPONINI" in the last 168 hours. BNP: Invalid input(s): "POCBNP" CBG: No results for input(s): "GLUCAP" in the last 168 hours. D-Dimer No results for input(s): "DDIMER" in the last 72 hours. Hgb A1c No results for input(s): "HGBA1C" in the last 72 hours. Lipid Profile No results for input(s): "CHOL", "HDL", "LDLCALC", "TRIG", "CHOLHDL", "LDLDIRECT" in the last 72 hours. Thyroid function studies No results for input(s): "TSH", "T4TOTAL", "T3FREE", "THYROIDAB" in the last 72 hours.  Invalid input(s): "FREET3" Anemia work up No results for input(s): "VITAMINB12", "FOLATE", "FERRITIN", "TIBC", "IRON", "RETICCTPCT" in the last 72 hours. Urinalysis    Component Value Date/Time   COLORURINE YELLOW 09/14/2016 1658   APPEARANCEUR HAZY (A) 09/14/2016 1658   LABSPEC 1.039 (H) 09/14/2016 1658   PHURINE 6.0 09/14/2016 1658   GLUCOSEU  NEGATIVE 09/14/2016 1658   HGBUR SMALL (A) 09/14/2016 1658   BILIRUBINUR NEGATIVE 09/14/2016 1658   KETONESUR NEGATIVE 09/14/2016 1658   PROTEINUR NEGATIVE 09/14/2016 1658   UROBILINOGEN 1.0 12/30/2011 1530   NITRITE NEGATIVE 09/14/2016 1658   LEUKOCYTESUR NEGATIVE 09/14/2016 1658   Sepsis Labs Recent Labs  Lab 10/29/22 1755 10/30/22 0112  WBC 14.8* 12.9*   Microbiology Recent Results (from the past 240 hour(s))  Resp panel by RT-PCR (RSV, Flu A&B, Covid) Anterior Nasal Swab     Status: None   Collection Time: 10/29/22  5:55 PM   Specimen: Anterior Nasal Swab  Result Value Ref Range Status   SARS Coronavirus 2 by RT PCR NEGATIVE NEGATIVE Final   Influenza A by PCR NEGATIVE NEGATIVE Final   Influenza B by PCR NEGATIVE NEGATIVE Final    Comment: (NOTE) The Xpert Xpress SARS-CoV-2/FLU/RSV plus assay is intended as an aid in the diagnosis of influenza from  Nasopharyngeal swab specimens and should not be used as a sole basis for treatment. Nasal washings and aspirates are unacceptable for Xpert Xpress SARS-CoV-2/FLU/RSV testing.  Fact Sheet for Patients: EntrepreneurPulse.com.au  Fact Sheet for Healthcare Providers: IncredibleEmployment.be  This test is not yet approved or cleared by the [place] FDA and has been authorized for detection and/or diagnosis of SARS-CoV-2 by FDA under an Emergency Use Authorization (EUA). This EUA will remain in effect (meaning this test can be used) for the duration of the COVID-19 declaration under Section 564(b)(1) of the Act, 21 U.S.C. section 360bbb-3(b)(1), unless the authorization is terminated or revoked.     Resp Syncytial Virus by PCR NEGATIVE NEGATIVE Final    Comment: (NOTE) Fact Sheet for Patients: EntrepreneurPulse.com.au  Fact Sheet for Healthcare Providers: IncredibleEmployment.be  This test is not yet approved or cleared by the [place] FDA  and has been authorized for detection and/or diagnosis of SARS-CoV-2 by FDA under an Emergency Use Authorization (EUA). This EUA will remain in effect (meaning this test can be used) for the duration of the COVID-19 declaration under Section 564(b)(1) of the Act, 21 U.S.C. section 360bbb-3(b)(1), unless the authorization is terminated or revoked.  Performed at [facility], [place] [address], [place], [place] [address]      Time coordinating discharge: Over 30 minutes  SIGNED:   Little Ishikawa, DO [facility] 11/01/2022, 10:06 AM Pager   If 7PM-7AM, please contact night-coverage [email or web address]

## 2022-11-02 ENCOUNTER — Other Ambulatory Visit: Payer: Self-pay | Admitting: Nurse Practitioner

## 2022-11-02 ENCOUNTER — Telehealth: Payer: Self-pay

## 2022-11-02 MED ORDER — MOMETASONE FURO-FORMOTEROL FUM 100-5 MCG/ACT IN AERO: 2.0000 | INHALATION_SPRAY | Freq: Two times a day (BID) | RESPIRATORY_TRACT | Status: DC

## 2022-11-02 NOTE — Progress Notes (Signed)
Breo Ellipta not covered by insurance-substituted [employer]

## 2022-11-02 NOTE — Transitions of Care (Post Inpatient/ED Visit) (Signed)
   11/02/2022  Name: Karthikeya Funke MRN: [id number] DOB: [birth year]  Today's TOC FU Call Status: Today's TOC FU Call Status:: Successful TOC FU Call Competed TOC FU Call Complete Date: 11/02/22  Transition Care Management Follow-up Telephone Call Date of Discharge: 11/01/22 Discharge Facility[facility]) Type of Discharge: Inpatient Admission Primary Inpatient Discharge Diagnosis:: resp distress How have you been since you were released from the hospital?: Better Any questions or concerns?: No  Items Reviewed: Did you receive and understand the discharge instructions provided?: Yes Medications obtained and verified?: Yes (Medications Reviewed) (he has all of his medications except the Adair Patter because it is not covered by his insurance.  It looks like Dulera has been substituted.) Any new allergies since your discharge?: No Dietary orders reviewed?: Yes Type of Diet Ordered:: haert healthy Do you have support at home?: Yes People in Home: parent(s) Name of Support/Comfort Primary Source: mother  Home Care and Equipment/Supplies: [place] Ordered?: No Any new equipment or medical supplies ordered?: No  Functional Questionnaire: Do you need assistance with bathing/showering or dressing?: No Do you need assistance with meal preparation?: No Do you need assistance with eating?: No Do you have difficulty maintaining continence: No Do you need assistance with getting out of bed/getting out of a chair/moving?: No Do you have difficulty managing or taking your medications?: No  Folllow up appointments reviewed: PCP Follow-up appointment confirmed?: Yes Date of PCP follow-up appointment?: 11/11/22 Follow-up Provider: Roberts Gaudy, NP [facility] Follow-up appointment confirmed?: NA Do you need transportation to your follow-up appointment?: No Do you understand care options if your condition(s) worsen?: Yes-patient verbalized  understanding    SIGNATURE Eden Lathe, RN

## 2022-11-02 NOTE — Telephone Encounter (Signed)
From the discharge call:  He said he is feeling much better and he has all of his medications except the Adair Patter because it is not covered by his insurance.  It looks like Ruthe Mannan has been substituted.)  Follow up with Geryl Rankins, NP 11/11/2022

## 2022-11-11 ENCOUNTER — Inpatient Hospital Stay: Payer: Medicaid Other | Admitting: Nurse Practitioner

## 2022-12-01 ENCOUNTER — Inpatient Hospital Stay: Payer: Medicaid Other | Admitting: Nurse Practitioner

## 2023-04-03 ENCOUNTER — Other Ambulatory Visit: Payer: Self-pay | Admitting: Nurse Practitioner

## 2023-04-03 DIAGNOSIS — I10 Essential (primary) hypertension: Secondary | ICD-10-CM

## 2023-04-05 MED ORDER — AMLODIPINE BESYLATE 10 MG PO TABS: 10.0000 mg | ORAL_TABLET | Freq: Every day | ORAL | 0 refills | Status: DC

## 2023-04-08 ENCOUNTER — Ambulatory Visit: Payer: MEDICAID | Attending: Physician Assistant | Admitting: Physician Assistant

## 2023-04-08 ENCOUNTER — Encounter: Payer: Self-pay | Admitting: Physician Assistant

## 2023-04-08 VITALS — BP 146/99 | HR 70 | Wt 350.2 lb

## 2023-04-08 DIAGNOSIS — E785 Hyperlipidemia, unspecified: Secondary | ICD-10-CM | POA: Diagnosis not present

## 2023-04-08 DIAGNOSIS — I10 Essential (primary) hypertension: Secondary | ICD-10-CM | POA: Diagnosis not present

## 2023-04-08 DIAGNOSIS — R7303 Prediabetes: Secondary | ICD-10-CM | POA: Diagnosis not present

## 2023-04-08 DIAGNOSIS — J9801 Acute bronchospasm: Secondary | ICD-10-CM

## 2023-04-08 DIAGNOSIS — F17210 Nicotine dependence, cigarettes, uncomplicated: Secondary | ICD-10-CM | POA: Diagnosis not present

## 2023-04-08 MED ORDER — ROSUVASTATIN CALCIUM 40 MG PO TABS: 40.0000 mg | ORAL_TABLET | Freq: Every day | ORAL | 3 refills | Status: DC

## 2023-04-08 MED ORDER — VALSARTAN-HYDROCHLOROTHIAZIDE 320-25 MG PO TABS: 1.0000 | ORAL_TABLET | Freq: Every day | ORAL | 1 refills | Status: DC

## 2023-04-08 MED ORDER — AMLODIPINE BESYLATE 10 MG PO TABS: 10.0000 mg | ORAL_TABLET | Freq: Every day | ORAL | 1 refills | Status: DC

## 2023-04-08 MED ORDER — VENTOLIN HFA 108 (90 BASE) MCG/ACT IN AERS: 1.0000 | INHALATION_SPRAY | RESPIRATORY_TRACT | 2 refills | Status: DC | PRN

## 2023-04-08 MED ORDER — CARVEDILOL 12.5 MG PO TABS: 12.5000 mg | ORAL_TABLET | Freq: Two times a day (BID) | ORAL | 1 refills | Status: DC

## 2023-04-08 NOTE — Progress Notes (Signed)
Patient ID: Cristian Anderson, male   DOB: [birth year], 47 y.o.   MRN: [id number]   Cristian Anderson, is a 47 y.o. male  [id number]  [id number]  DOB - [birth year]  Chief Complaint  Patient presents with   Hypertension       Subjective:   Cristian Anderson is a 47 y.o. male here today for med RF.  He is out of valsartan and carvedilol and has only had his amlodipine for a few days.  Denies CP/SOB/HA.   He does have mental health provider that he does video visits with.  He is currently stable on meds.  Considers himself to have anger issues.  H/o bi[polar/schizophrenia.  PHQ9 #9=1.  Denies plan or intent to harm himself.  He says sometimes he just "gets tired of it all" but would never harm himself.     No problems updated.  ALLERGIES: Allergies  Allergen Reactions   Penicillins Itching   Tylenol [Acetaminophen] Itching    PAST MEDICAL HISTORY: Past Medical History:  Diagnosis Date   Anxiety attack    Bipolar 1 disorder (HCC)    Fatty liver    GERD (gastroesophageal reflux disease)    Hypertension    Obesity    Panic attack    PTSD (post-traumatic stress disorder)    Schizophrenia (HCC)     MEDICATIONS AT HOME: Prior to Admission medications   Medication Sig Start Date End Date Taking? Authorizing Provider  albuterol (VENTOLIN HFA) 108 (90 Base) MCG/ACT inhaler Inhale 1-2 puffs into the lungs every 4 (four) hours as needed for wheezing or shortness of breath. 04/08/23  Yes Anders Simmonds, PA-C  Blood Pressure Monitor DEVI Please provide patient with insurance approved blood pressure . Device broken. 06/22/22  Yes Claiborne Rigg, NP  FLUoxetine (PROZAC) 20 MG capsule Take 20 mg by mouth 4 (four) times daily.   Yes [provider]  amLODipine (NORVASC) 10 MG tablet Take 1 tablet (10 mg total) by mouth daily. 04/08/23   Anders Simmonds, PA-C  carvedilol (COREG) 12.5 MG tablet Take 1 tablet (12.5 mg total) by mouth 2 (two) times daily with a meal. 04/08/23   Finnley Larusso, Marzella Schlein, PA-C  rosuvastatin (CRESTOR) 40 MG tablet Take 1 tablet (40 mg total) by mouth daily. 04/08/23   Anders Simmonds, PA-C  valsartan-hydrochlorothiazide (DIOVAN-HCT) 320-25 MG tablet Take 1 tablet by mouth daily. 04/08/23   Anders Simmonds, PA-C  carvedilol (COREG) 6.25 MG tablet Take 1 tablet (6.25 mg total) by mouth 2 (two) times daily with a meal. 07/31/22   Claiborne Rigg, NP    ROS: Neg HEENT Neg resp Neg cardiac Neg GI Neg GU Neg MS Neg neuro  Objective:   Vitals:   04/08/23 1123 04/08/23 1130  BP: (!) 157/111 (!) 146/99  Pulse: 71 70  SpO2: 90% 94%  Weight: (!) 350 lb 3.2 oz (158.8 kg)    Exam General appearance : Awake, alert, not in any distress. Speech Clear. Not toxic looking; pleasant, cooperative HEENT: Atraumatic and Normocephalic,Neck: Supple, no JVD. No cervical lymphadenopathy.  Chest: Good air entry bilaterally, CTAB.  No rales/rhonchi/wheezing CVS: S1 S2 regular, no murmurs.  Extremities: B/L Lower Ext shows no edema, both legs are warm to touch Neurology: Awake alert, and oriented X 3, CN II-XII intact, Non focal Skin: No Rash  Data Review Lab Results  Component Value Date   HGBA1C 6.2 (H) 03/04/2022    Assessment & Plan   1. Essential hypertension Not at goal  but out of meds other than amlodipine.  Resume meds - carvedilol (COREG) 12.5 MG tablet; Take 1 tablet (12.5 mg total) by mouth 2 (two) times daily with a meal.  Dispense: 180 tablet; Refill: 1 - amLODipine (NORVASC) 10 MG tablet; Take 1 tablet (10 mg total) by mouth daily.  Dispense: 90 tablet; Refill: 1 - valsartan-hydrochlorothiazide (DIOVAN-HCT) 320-25 MG tablet; Take 1 tablet by mouth daily.  Dispense: 90 tablet; Refill: 1 - Basic metabolic panel  2. Prediabetes - Hemoglobin A1c - Basic metabolic panel  3. Dyslipidemia, goal LDL below 70 - rosuvastatin (CRESTOR) 40 MG tablet; Take 1 tablet (40 mg total) by mouth daily.  Dispense: 90 tablet; Refill: 3  4. Cigarette  smoker Smoking and dangers of nicotine have been discussed at length. Long term health consequences of smoking reviewed in detail.  Methods for helping with cessation have been reviewed.  Patient expresses understanding.   5. Bronchospasm - albuterol (VENTOLIN HFA) 108 (90 Base) MCG/ACT inhaler; Inhale 1-2 puffs into the lungs every 4 (four) hours as needed for wheezing or shortness of breath.  Dispense: 18 each; Refill: 2    Return in about 3 months (around 07/09/2023) for Zelda for chronic conditions.  The patient was given clear instructions to go to ER or return to medical center if symptoms don't improve, worsen or new problems develop. The patient verbalized understanding. The patient was told to call to get lab results if they haven't heard anything in the next week.      Georgian Co, PA-C [facility] [place], [place] [phone]   04/08/2023, 12:01 PM

## 2023-04-09 LAB — BASIC METABOLIC PANEL
BUN/Creatinine Ratio: 10 (ref 9–20)
BUN: 11 mg/dL (ref 6–24)
CO2: 26 mmol/L (ref 20–29)
Calcium: 9.1 mg/dL (ref 8.7–10.2)
Chloride: 101 mmol/L (ref 96–106)
Creatinine, Ser: 1.07 mg/dL (ref 0.76–1.27)
Glucose: 106 mg/dL — ABNORMAL HIGH (ref 70–99)
Potassium: 4.2 mmol/L (ref 3.5–5.2)
Sodium: 139 mmol/L (ref 134–144)
eGFR: 86 mL/min/{1.73_m2} (ref >59)

## 2023-04-09 LAB — HEMOGLOBIN A1C
Est. average glucose Bld gHb Est-mCnc: 131 mg/dL
Hgb A1c MFr Bld: 6.2 % — ABNORMAL HIGH (ref 4.8–5.6)

## 2023-05-15 ENCOUNTER — Ambulatory Visit: Payer: BLUE CROSS/BLUE SHIELD | Attending: Family

## 2023-05-15 VITALS — BP 134/85 | HR 80

## 2023-05-15 DIAGNOSIS — Z0130 Encounter for examination of blood pressure without abnormal findings: Secondary | ICD-10-CM | POA: Diagnosis not present

## 2023-05-15 NOTE — Progress Notes (Addendum)
Blood Pressure Recheck Visit  Name: Cristian Anderson MRN: [id number] Date of Birth: Jun 14, 1976  Cristian Anderson presents today for Blood Pressure recheck with clinical support staff.    BP Readings from Last 3 Encounters:  05/15/23 134/85  04/08/23 (!) 146/99  11/01/22 124/83    Current Outpatient Medications  Medication Sig Dispense Refill   albuterol (VENTOLIN HFA) 108 (90 Base) MCG/ACT inhaler Inhale 1-2 puffs into the lungs every 4 (four) hours as needed for wheezing or shortness of breath. 18 each 2   amLODipine (NORVASC) 10 MG tablet Take 1 tablet (10 mg total) by mouth daily. 90 tablet 1   Blood Pressure Monitor DEVI Please provide patient with insurance approved blood pressure . Device broken. 1 each 0   carvedilol (COREG) 12.5 MG tablet Take 1 tablet (12.5 mg total) by mouth 2 (two) times daily with a meal. 180 tablet 1   FLUoxetine (PROZAC) 20 MG capsule Take 20 mg by mouth 4 (four) times daily.     rosuvastatin (CRESTOR) 40 MG tablet Take 1 tablet (40 mg total) by mouth daily. 90 tablet 3   valsartan-hydrochlorothiazide (DIOVAN-HCT) 320-25 MG tablet Take 1 tablet by mouth daily. 90 tablet 1   Current Facility-Administered Medications  Medication Dose Route Frequency Provider Last Rate Last Admin   mometasone-formoterol (DULERA) 100-5 MCG/ACT inhaler 2 puff  2 puff Inhalation BID Russella Dar, NP        Hypertensive Medication Review: Patient states that they are taking all their hypertensive medications as prescribed and their last dose of hypertensive medications was this morning   Documentation of any medication adherence discrepancies: none   Patient has been scheduled to follow up with PCP on 07/13/2023   Patient has been given provider's recommendations and does not have any questions or concerns at this time. Patient will contact the office for any future questions or concerns.

## 2023-07-13 ENCOUNTER — Encounter: Payer: Self-pay | Admitting: Nurse Practitioner

## 2023-07-13 ENCOUNTER — Ambulatory Visit: Payer: MEDICAID | Attending: Nurse Practitioner | Admitting: Nurse Practitioner

## 2023-07-13 VITALS — BP 138/79 | HR 77 | Ht 68.0 in | Wt 366.0 lb

## 2023-07-13 DIAGNOSIS — Z1211 Encounter for screening for malignant neoplasm of colon: Secondary | ICD-10-CM | POA: Diagnosis not present

## 2023-07-13 DIAGNOSIS — I10 Essential (primary) hypertension: Secondary | ICD-10-CM | POA: Diagnosis not present

## 2023-07-13 NOTE — Progress Notes (Signed)
Assessment & Plan:  Cristian "[place]" was seen today for medical management of chronic issues.  Diagnoses and all orders for this visit:  Essential hypertension Will have him change his battery for his blood pressure monitoring device and log his blood pressure readings for the next 2 weeks.  We will have a virtual visit to see if home readings are similar to office readings. Continue all antihypertensives as prescribed.  Reminded to bring in blood pressure log for follow  up appointment.  RECOMMENDATIONS: DASH/Mediterranean Diets are healthier choices for HTN.     Colon cancer screening -     Ambulatory referral to Gastroenterology    Patient has been counseled on age-appropriate routine health concerns for screening and prevention. These are reviewed and up-to-date. Referrals have been placed accordingly. Immunizations are up-to-date or declined.    Subjective:   Chief Complaint  Patient presents with   Medical Management of Chronic Issues    Cristian Anderson 47 y.o. male presents to office today for follow-up to hypertension.  He has a past medical history of Anxiety attack, Bipolar 1 disorder Fatty liver, GERD, Hypertension, Obesity, Panic attack, PTSD, and Schizophrenia.     HTN Blood pressure is controlled today.  Despite his reports of smoking cigarettes prior to his visit this morning.  He has a blood pressure device at home but states that needs batteries so he has not been able to use it.  He endorses adherence taking amlodipine 10 mg daily, carvedilol 12.5 mg twice daily and Diovan-HCTZ 320-25 mg daily.  He does have a history of OSA but notes nightly compliance with CPAP. BP Readings from Last 3 Encounters:  07/13/23 138/79  05/15/23 134/85  04/08/23 (!) 146/99       Review of Systems  Constitutional:  Negative for fever, malaise/fatigue and weight loss.  HENT: Negative.  Negative for nosebleeds.   Eyes: Negative.  Negative for blurred vision, double vision and  photophobia.  Respiratory: Negative.  Negative for cough and shortness of breath.   Cardiovascular: Negative.  Negative for chest pain, palpitations and leg swelling.  Gastrointestinal: Negative.  Negative for heartburn, nausea and vomiting.  Musculoskeletal: Negative.  Negative for myalgias.  Neurological: Negative.  Negative for dizziness, focal weakness, seizures and headaches.  Psychiatric/Behavioral: Negative.  Negative for suicidal ideas.     Past Medical History:  Diagnosis Date   Anxiety attack    Bipolar 1 disorder (HCC)    Fatty liver    GERD (gastroesophageal reflux disease)    Hypertension    Obesity    Panic attack    PTSD (post-traumatic stress disorder)    Schizophrenia (HCC)     History reviewed. No pertinent surgical history.  Family History  Problem Relation Age of Onset   Diabetes Father    Colon polyps Brother    Heart disease Maternal Aunt     Social History Reviewed with no changes to be made today.   Outpatient Medications Prior to Visit  Medication Sig Dispense Refill   albuterol (VENTOLIN HFA) 108 (90 Base) MCG/ACT inhaler Inhale 1-2 puffs into the lungs every 4 (four) hours as needed for wheezing or shortness of breath. 18 each 2   amLODipine (NORVASC) 10 MG tablet Take 1 tablet (10 mg total) by mouth daily. 90 tablet 1   Blood Pressure Monitor DEVI Please provide patient with insurance approved blood pressure . Device broken. 1 each 0   carvedilol (COREG) 12.5 MG tablet Take 1 tablet (12.5 mg total) by mouth  2 (two) times daily with a meal. 180 tablet 1   FLUoxetine (PROZAC) 20 MG capsule Take 20 mg by mouth 4 (four) times daily.     rosuvastatin (CRESTOR) 40 MG tablet Take 1 tablet (40 mg total) by mouth daily. 90 tablet 3   valsartan-hydrochlorothiazide (DIOVAN-HCT) 320-25 MG tablet Take 1 tablet by mouth daily. 90 tablet 1   Facility-Administered Medications Prior to Visit  Medication Dose Route Frequency Provider Last Rate Last Admin    mometasone-formoterol (DULERA) 100-5 MCG/ACT inhaler 2 puff  2 puff Inhalation BID Russella Dar, NP        Allergies  Allergen Reactions   Acetaminophen Itching and Rash   Penicillins Itching and Rash       Objective:    BP 138/79   Pulse 77   Ht 5\' 8"  (1.727 m)   Wt (!) 366 lb (166 kg)   SpO2 92%   BMI 55.65 kg/m  Wt Readings from Last 3 Encounters:  07/13/23 (!) 366 lb (166 kg)  04/08/23 (!) 350 lb 3.2 oz (158.8 kg)  10/29/22 (!) 350 lb (158.8 kg)    Physical Exam Vitals and nursing note reviewed.  Constitutional:      Appearance: He is well-developed.  HENT:     Head: Normocephalic and atraumatic.  Cardiovascular:     Rate and Rhythm: Normal rate and regular rhythm.     Heart sounds: Normal heart sounds. No murmur heard.    No friction rub. No gallop.  Pulmonary:     Effort: Pulmonary effort is normal. No tachypnea or respiratory distress.     Breath sounds: Normal breath sounds. No decreased breath sounds, wheezing, rhonchi or rales.  Chest:     Chest wall: No tenderness.  Abdominal:     General: Bowel sounds are normal.     Palpations: Abdomen is soft.  Musculoskeletal:        General: Normal range of motion.     Cervical back: Normal range of motion.  Skin:    General: Skin is warm and dry.  Neurological:     Mental Status: He is alert and oriented to person, place, and time.     Coordination: Coordination normal.  Psychiatric:        Behavior: Behavior normal. Behavior is cooperative.        Thought Content: Thought content normal.        Judgment: Judgment normal.          Patient has been counseled extensively about nutrition and exercise as well as the importance of adherence with medications and regular follow-up. The patient was given clear instructions to go to ER or return to medical center if symptoms don't improve, worsen or new problems develop. The patient verbalized understanding.   Follow-up: Return for virtual visit 2 weeks. BP  check 1110 or 130 may DB.   Claiborne Rigg, FNP-BC [facility] [place], [place] [phone]   07/13/2023, 1:36 PM

## 2023-07-22 ENCOUNTER — Other Ambulatory Visit: Payer: Self-pay

## 2023-07-22 ENCOUNTER — Encounter (HOSPITAL_COMMUNITY): Payer: Self-pay

## 2023-07-22 ENCOUNTER — Observation Stay (HOSPITAL_COMMUNITY)
Admission: EM | Admit: 2023-07-22 | Discharge: 2023-07-24 | Disposition: A | Discharge status: Home / Self Care | Payer: BLUE CROSS/BLUE SHIELD

## 2023-07-22 ENCOUNTER — Emergency Department (HOSPITAL_COMMUNITY): Payer: BLUE CROSS/BLUE SHIELD

## 2023-07-22 DIAGNOSIS — R202 Paresthesia of skin: Secondary | ICD-10-CM | POA: Diagnosis not present

## 2023-07-22 DIAGNOSIS — E876 Hypokalemia: Secondary | ICD-10-CM | POA: Diagnosis not present

## 2023-07-22 DIAGNOSIS — J9601 Acute respiratory failure with hypoxia: Principal | ICD-10-CM | POA: Diagnosis present

## 2023-07-22 DIAGNOSIS — Z1152 Encounter for screening for COVID-19: Secondary | ICD-10-CM | POA: Diagnosis not present

## 2023-07-22 DIAGNOSIS — F17210 Nicotine dependence, cigarettes, uncomplicated: Secondary | ICD-10-CM | POA: Diagnosis not present

## 2023-07-22 DIAGNOSIS — I160 Hypertensive urgency: Secondary | ICD-10-CM | POA: Insufficient documentation

## 2023-07-22 DIAGNOSIS — D72829 Elevated white blood cell count, unspecified: Secondary | ICD-10-CM | POA: Diagnosis not present

## 2023-07-22 DIAGNOSIS — I119 Hypertensive heart disease without heart failure: Secondary | ICD-10-CM | POA: Insufficient documentation

## 2023-07-22 DIAGNOSIS — I10 Essential (primary) hypertension: Secondary | ICD-10-CM | POA: Insufficient documentation

## 2023-07-22 DIAGNOSIS — Z79899 Other long term (current) drug therapy: Secondary | ICD-10-CM | POA: Insufficient documentation

## 2023-07-22 DIAGNOSIS — G4733 Obstructive sleep apnea (adult) (pediatric): Secondary | ICD-10-CM | POA: Insufficient documentation

## 2023-07-22 DIAGNOSIS — Z6841 Body Mass Index (BMI) 40.0 and over, adult: Secondary | ICD-10-CM | POA: Diagnosis not present

## 2023-07-22 DIAGNOSIS — R0602 Shortness of breath: Secondary | ICD-10-CM | POA: Diagnosis present

## 2023-07-22 LAB — RESP PANEL BY RT-PCR (RSV, FLU A&B, COVID)  RVPGX2
Influenza A by PCR: NEGATIVE
Influenza B by PCR: NEGATIVE
Resp Syncytial Virus by PCR: NEGATIVE
SARS Coronavirus 2 by RT PCR: NEGATIVE

## 2023-07-22 LAB — COMPREHENSIVE METABOLIC PANEL
ALT: 50 U/L — ABNORMAL HIGH (ref 0–44)
AST: 38 U/L (ref 15–41)
Albumin: 4.1 g/dL (ref 3.5–5.0)
Alkaline Phosphatase: 54 U/L (ref 38–126)
Anion gap: 9 (ref 5–15)
BUN: 14 mg/dL (ref 6–20)
CO2: 26 mmol/L (ref 22–32)
Calcium: 8.7 mg/dL — ABNORMAL LOW (ref 8.9–10.3)
Chloride: 99 mmol/L (ref 98–111)
Creatinine, Ser: 1.06 mg/dL (ref 0.61–1.24)
GFR, Estimated: >60 mL/min (ref >60)
Glucose, Bld: 118 mg/dL — ABNORMAL HIGH (ref 70–99)
Potassium: 3.5 mmol/L (ref 3.5–5.1)
Sodium: 134 mmol/L — ABNORMAL LOW (ref 135–145)
Total Bilirubin: 0.8 mg/dL (ref <1.2)
Total Protein: 7.7 g/dL (ref 6.5–8.1)

## 2023-07-22 LAB — CBC
HCT: 46.1 % (ref 39.0–52.0)
Hemoglobin: 15.5 g/dL (ref 13.0–17.0)
MCH: 30.4 pg (ref 26.0–34.0)
MCHC: 33.6 g/dL (ref 30.0–36.0)
MCV: 90.4 fL (ref 80.0–100.0)
Platelets: 206 10*3/uL (ref 150–400)
RBC: 5.1 MIL/uL (ref 4.22–5.81)
RDW: 13.8 % (ref 11.5–15.5)
WBC: 9.7 10*3/uL (ref 4.0–10.5)
nRBC: 0 % (ref 0.0–0.2)

## 2023-07-22 LAB — D-DIMER, QUANTITATIVE: D-Dimer, Quant: 0.45 ug{FEU}/mL (ref 0.00–0.50)

## 2023-07-22 LAB — TROPONIN I (HIGH SENSITIVITY)
Troponin I (High Sensitivity): 8 ng/L (ref <18)
Troponin I (High Sensitivity): 6 ng/L (ref <18)

## 2023-07-22 LAB — LIPASE, BLOOD: Lipase: 30 U/L (ref 11–51)

## 2023-07-22 LAB — BRAIN NATRIURETIC PEPTIDE: B Natriuretic Peptide: 27 pg/mL (ref 0.0–100.0)

## 2023-07-22 MED ORDER — ALBUTEROL SULFATE (2.5 MG/3ML) 0.083% IN NEBU: 2.5000 mg | INHALATION_SOLUTION | RESPIRATORY_TRACT | Status: DC | PRN

## 2023-07-22 MED ORDER — ONDANSETRON HCL 4 MG/2ML IJ SOLN
4.0000 mg | Freq: Once | INTRAMUSCULAR | Status: AC
  Administered 2023-07-22: 4 mg via INTRAVENOUS
  Filled 2023-07-22: qty 2

## 2023-07-22 MED ORDER — PREDNISONE 20 MG PO TABS
40.0000 mg | ORAL_TABLET | Freq: Every day | ORAL | Status: DC
  Administered 2023-07-23 – 2023-07-24 (×2): 40 mg via ORAL
  Filled 2023-07-22 (×2): qty 2

## 2023-07-22 MED ORDER — METHYLPREDNISOLONE SODIUM SUCC 125 MG IJ SOLR: 125.0000 mg | Freq: Two times a day (BID) | INTRAMUSCULAR | Status: DC

## 2023-07-22 MED ORDER — METHYLPREDNISOLONE SODIUM SUCC 125 MG IJ SOLR
125.0000 mg | Freq: Once | INTRAMUSCULAR | Status: AC
  Administered 2023-07-22: 125 mg via INTRAVENOUS
  Filled 2023-07-22: qty 2

## 2023-07-22 MED ORDER — IPRATROPIUM-ALBUTEROL 0.5-2.5 (3) MG/3ML IN SOLN
3.0000 mL | Freq: Once | RESPIRATORY_TRACT | Status: AC
  Administered 2023-07-22: 3 mL via RESPIRATORY_TRACT
  Filled 2023-07-22: qty 3

## 2023-07-22 MED ORDER — ENOXAPARIN SODIUM 40 MG/0.4ML IJ SOSY
40.0000 mg | PREFILLED_SYRINGE | INTRAMUSCULAR | Status: DC
  Administered 2023-07-22: 40 mg via SUBCUTANEOUS
  Filled 2023-07-22: qty 0.4

## 2023-07-22 MED ORDER — VALSARTAN-HYDROCHLOROTHIAZIDE 320-25 MG PO TABS: 1.0000 | ORAL_TABLET | Freq: Every day | ORAL | Status: DC

## 2023-07-22 MED ORDER — POLYETHYLENE GLYCOL 3350 17 G PO PACK: 17.0000 g | PACK | Freq: Every day | ORAL | Status: DC | PRN

## 2023-07-22 MED ORDER — SODIUM CHLORIDE 0.9 % IV BOLUS
1000.0000 mL | Freq: Once | INTRAVENOUS | Status: AC
  Administered 2023-07-22: 1000 mL via INTRAVENOUS

## 2023-07-22 MED ORDER — MOMETASONE FURO-FORMOTEROL FUM 100-5 MCG/ACT IN AERO
2.0000 | INHALATION_SPRAY | Freq: Two times a day (BID) | RESPIRATORY_TRACT | Status: DC
  Administered 2023-07-23 – 2023-07-24 (×3): 2 via RESPIRATORY_TRACT
  Filled 2023-07-22: qty 8.8

## 2023-07-22 MED ORDER — CARVEDILOL 12.5 MG PO TABS
12.5000 mg | ORAL_TABLET | Freq: Two times a day (BID) | ORAL | Status: DC
  Administered 2023-07-23 – 2023-07-24 (×3): 12.5 mg via ORAL
  Filled 2023-07-22 (×3): qty 1

## 2023-07-22 MED ORDER — LABETALOL HCL 5 MG/ML IV SOLN: 10.0000 mg | INTRAVENOUS | Status: DC | PRN

## 2023-07-22 MED ORDER — AZITHROMYCIN 250 MG PO TABS
500.0000 mg | ORAL_TABLET | Freq: Every day | ORAL | Status: AC
  Administered 2023-07-22 – 2023-07-24 (×3): 500 mg via ORAL
  Filled 2023-07-22 (×3): qty 2

## 2023-07-22 MED ORDER — IRBESARTAN 150 MG PO TABS
300.0000 mg | ORAL_TABLET | Freq: Every day | ORAL | Status: DC
  Administered 2023-07-22 – 2023-07-24 (×3): 300 mg via ORAL
  Filled 2023-07-22 (×2): qty 2
  Filled 2023-07-22: qty 1

## 2023-07-22 MED ORDER — ALBUTEROL SULFATE (2.5 MG/3ML) 0.083% IN NEBU: 3.0000 mL | INHALATION_SOLUTION | RESPIRATORY_TRACT | Status: DC | PRN

## 2023-07-22 MED ORDER — IPRATROPIUM-ALBUTEROL 0.5-2.5 (3) MG/3ML IN SOLN
3.0000 mL | Freq: Three times a day (TID) | RESPIRATORY_TRACT | Status: DC
  Filled 2023-07-22: qty 3

## 2023-07-22 MED ORDER — SODIUM CHLORIDE 0.9% FLUSH
3.0000 mL | Freq: Two times a day (BID) | INTRAVENOUS | Status: DC
  Administered 2023-07-22 – 2023-07-23 (×3): 3 mL via INTRAVENOUS

## 2023-07-22 MED ORDER — ONDANSETRON HCL 4 MG PO TABS: 4.0000 mg | ORAL_TABLET | Freq: Four times a day (QID) | ORAL | Status: DC | PRN

## 2023-07-22 MED ORDER — ONDANSETRON HCL 4 MG/2ML IJ SOLN: 4.0000 mg | Freq: Four times a day (QID) | INTRAMUSCULAR | Status: DC | PRN

## 2023-07-22 MED ORDER — OXYCODONE HCL 5 MG PO TABS: 5.0000 mg | ORAL_TABLET | ORAL | Status: DC | PRN

## 2023-07-22 MED ORDER — HYDROCHLOROTHIAZIDE 25 MG PO TABS
25.0000 mg | ORAL_TABLET | Freq: Every day | ORAL | Status: DC
  Administered 2023-07-22 – 2023-07-24 (×3): 25 mg via ORAL
  Filled 2023-07-22 (×3): qty 1

## 2023-07-22 MED ORDER — AMLODIPINE BESYLATE 10 MG PO TABS
10.0000 mg | ORAL_TABLET | Freq: Every day | ORAL | Status: DC
  Administered 2023-07-22 – 2023-07-24 (×3): 10 mg via ORAL
  Filled 2023-07-22: qty 1
  Filled 2023-07-22: qty 2
  Filled 2023-07-22: qty 1

## 2023-07-22 MED ORDER — IBUPROFEN 200 MG PO TABS: 400.0000 mg | ORAL_TABLET | Freq: Four times a day (QID) | ORAL | Status: DC | PRN

## 2023-07-22 MED ORDER — SODIUM CHLORIDE 0.9 % IV SOLN: 1.0000 g | INTRAVENOUS | Status: DC

## 2023-07-22 MED ORDER — ORAL CARE MOUTH RINSE: 15.0000 mL | OROMUCOSAL | Status: DC | PRN

## 2023-07-22 MED ORDER — ROSUVASTATIN CALCIUM 10 MG PO TABS
40.0000 mg | ORAL_TABLET | Freq: Every day | ORAL | Status: DC
  Administered 2023-07-23 – 2023-07-24 (×2): 40 mg via ORAL
  Filled 2023-07-22 (×2): qty 4

## 2023-07-22 NOTE — Progress Notes (Signed)
   07/22/23 2336  BiPAP/CPAP/SIPAP  $ Non-Invasive Home Ventilator  Initial  $ Face Mask Medium Yes  BiPAP/CPAP/SIPAP Pt Type Adult  BiPAP/CPAP/SIPAP DREAMSTATIOND  Mask Type Full face mask  Mask Size Medium  Flow Rate 2 lpm  Patient Home Equipment No  Auto Titrate Yes (5-20)  BiPAP/CPAP /SiPAP Vitals  Pulse Rate 97  SpO2 99 %  Bilateral Breath Sounds Diminished  MEWS Score/Color  MEWS Score 0  MEWS Score Color [place] Si

## 2023-07-22 NOTE — Progress Notes (Signed)
Skin Assessment delayed for 2 hours to let the patient recover. He is not talking much at this time.

## 2023-07-22 NOTE — ED Notes (Signed)
ED TO INPATIENT HANDOFF REPORT  Name/Age/Gender Cristian Anderson 47 y.o. male  Code Status    Code Status Orders  (From admission, onward)           Start     Ordered   07/22/23 2134  Full code  Continuous       Question:  By:  Answer:  Consent: discussion documented in EHR   07/22/23 2134           Code Status History     Date Active Date Inactive Code Status Order ID Comments User Context   10/29/2022 2027 11/01/2022 1559 Full Code [id number]  Anselm Jungling, DO ED   01/29/2014 0127 01/30/2014 1401 Full Code [id number]  Dione Booze, MD ED       Home/SNF/Other Home  Chief Complaint Acute respiratory failure with hypoxia (HCC) [J96.01]  Level of Care/Admitting Diagnosis ED Disposition     ED Disposition  Admit   Condition  --   Comment  Hospital Area: [facility] [[id number]]  Level of Care: Telemetry [5]  Admit to tele based on following criteria: Acute CHF  May place patient in observation at [facility] or [facility] if equivalent level of care is available:: Yes  Covid Evaluation: Confirmed COVID Negative  Diagnosis: Acute respiratory failure with hypoxia [facility]) [[id number]]  Admitting Physician: Briscoe Deutscher [[id number]]  Attending Physician: Briscoe Deutscher [[id number]]          Medical History Past Medical History:  Diagnosis Date   Anxiety attack    Bipolar 1 disorder (HCC)    Fatty liver    GERD (gastroesophageal reflux disease)    Hypertension    Obesity    Panic attack    PTSD (post-traumatic stress disorder)    Schizophrenia (HCC)     Allergies Allergies  Allergen Reactions   Acetaminophen Itching and Rash   Penicillins Itching and Rash    IV Location/Drains/Wounds Patient Lines/Drains/Airways Status     Active Line/Drains/Airways     Name Placement date Placement time Site Days   Peripheral IV 07/22/23 20 G 1" Anterior;Distal;Right;Upper Arm 07/22/23  1758  Arm  less than 1            Labs/Imaging Results for  orders placed or performed during the hospital encounter of 07/22/23 (from the past 48 hour(s))  CBC     Status: None   Collection Time: 07/22/23  5:55 PM  Result Value Ref Range   WBC 9.7 4.0 - 10.5 K/uL   RBC 5.10 4.22 - 5.81 MIL/uL   Hemoglobin 15.5 13.0 - 17.0 g/dL   HCT 95.2 84.1 - 32.4 %   MCV 90.4 80.0 - 100.0 fL   MCH 30.4 26.0 - 34.0 pg   MCHC 33.6 30.0 - 36.0 g/dL   RDW 40.1 02.7 - 25.3 %   Platelets 206 150 - 400 K/uL   nRBC 0.0 0.0 - 0.2 %    Comment: Performed at [facility], [address], [place], [place] [address]  Comprehensive metabolic panel     Status: Abnormal   Collection Time: 07/22/23  5:55 PM  Result Value Ref Range   Sodium 134 (L) 135 - 145 mmol/L   Potassium 3.5 3.5 - 5.1 mmol/L   Chloride 99 98 - 111 mmol/L   CO2 26 22 - 32 mmol/L   Glucose, Bld 118 (H) 70 - 99 mg/dL    Comment: Glucose reference range applies only to samples taken after fasting  for at least 8 hours.   BUN 14 6 - 20 mg/dL   Creatinine, Ser 1.91 0.61 - 1.24 mg/dL   Calcium 8.7 (L) 8.9 - 10.3 mg/dL   Total Protein 7.7 6.5 - 8.1 g/dL   Albumin 4.1 3.5 - 5.0 g/dL   AST 38 15 - 41 U/L   ALT 50 (H) 0 - 44 U/L   Alkaline Phosphatase 54 38 - 126 U/L   Total Bilirubin 0.8 <1.2 mg/dL   GFR, Estimated >47 >82 mL/min    Comment: (NOTE) Calculated using the CKD-EPI Creatinine Equation (2021)    Anion gap 9 5 - 15    Comment: Performed at [facility], [address] [place], [place] [address]  Lipase, blood     Status: None   Collection Time: 07/22/23  5:55 PM  Result Value Ref Range   Lipase 30 11 - 51 U/L    Comment: Performed at [facility], [address], [place], [place] [address]  Troponin I (High Sensitivity)     Status: None   Collection Time: 07/22/23  5:55 PM  Result Value Ref Range   Troponin I (High Sensitivity) 8 <18 ng/L    Comment: (NOTE) Elevated high sensitivity troponin I (hsTnI) values and significant   changes across serial measurements may suggest ACS but many other  chronic and acute conditions are known to elevate hsTnI results.  Refer to the "Links" section for chest pain algorithms and additional  guidance. Performed at [facility], [address], [place], [place] [address]   D-dimer, quantitative     Status: None   Collection Time: 07/22/23  5:55 PM  Result Value Ref Range   D-Dimer, Quant 0.45 0.00 - 0.50 ug/mL-FEU    Comment: (NOTE) At the manufacturer cut-off value of 0.5 g/mL FEU, this assay has a negative predictive value of 95-100%.This assay is intended for use in conjunction with a clinical pretest probability (PTP) assessment model to exclude pulmonary embolism (PE) and deep venous thrombosis (DVT) in outpatients suspected of PE or DVT. Results should be correlated with clinical presentation. Performed at [facility], [address], [place], [place] [address]   Brain natriuretic peptide     Status: None   Collection Time: 07/22/23  5:55 PM  Result Value Ref Range   B Natriuretic Peptide 27.0 0.0 - 100.0 pg/mL    Comment: Performed at [facility], [address], [place], [place] [address]  Resp panel by RT-PCR (RSV, Flu A&B, Covid) Anterior Nasal Swab     Status: None   Collection Time: 07/22/23  6:48 PM   Specimen: Anterior Nasal Swab  Result Value Ref Range   SARS Coronavirus 2 by RT PCR NEGATIVE NEGATIVE    Comment: (NOTE) SARS-CoV-2 target nucleic acids are NOT DETECTED.  The SARS-CoV-2 RNA is generally detectable in upper respiratory specimens during the acute phase of infection. The lowest concentration of SARS-CoV-2 viral copies this assay can detect is 138 copies/mL. A negative result does not preclude SARS-Cov-2 infection and should not be used as the sole basis for treatment or other patient management decisions. A negative result may occur with  improper specimen collection/handling,  submission of specimen other than nasopharyngeal swab, presence of viral mutation(s) within the areas targeted by this assay, and inadequate number of viral copies(<138 copies/mL). A negative result must be combined with clinical observations, patient history, and epidemiological information. The expected result is Negative.  Fact Sheet  for Patients:  BloggerCourse.com  Fact Sheet for Healthcare Providers:  SeriousBroker.it  This test is no t yet approved or cleared by the [place] FDA and  has been authorized for detection and/or diagnosis of SARS-CoV-2 by FDA under an Emergency Use Authorization (EUA). This EUA will remain  in effect (meaning this test can be used) for the duration of the COVID-19 declaration under Section 564(b)(1) of the Act, 21 U.S.C.section 360bbb-3(b)(1), unless the authorization is terminated  or revoked sooner.       Influenza A by PCR NEGATIVE NEGATIVE   Influenza B by PCR NEGATIVE NEGATIVE    Comment: (NOTE) The Xpert Xpress SARS-CoV-2/FLU/RSV plus assay is intended as an aid in the diagnosis of influenza from Nasopharyngeal swab specimens and should not be used as a sole basis for treatment. Nasal washings and aspirates are unacceptable for Xpert Xpress SARS-CoV-2/FLU/RSV testing.  Fact Sheet for Patients: BloggerCourse.com  Fact Sheet for Healthcare Providers: SeriousBroker.it  This test is not yet approved or cleared by the [place] FDA and has been authorized for detection and/or diagnosis of SARS-CoV-2 by FDA under an Emergency Use Authorization (EUA). This EUA will remain in effect (meaning this test can be used) for the duration of the COVID-19 declaration under Section 564(b)(1) of the Act, 21 U.S.C. section 360bbb-3(b)(1), unless the authorization is terminated or revoked.     Resp Syncytial Virus by PCR NEGATIVE NEGATIVE     Comment: (NOTE) Fact Sheet for Patients: BloggerCourse.com  Fact Sheet for Healthcare Providers: SeriousBroker.it  This test is not yet approved or cleared by the [place] FDA and has been authorized for detection and/or diagnosis of SARS-CoV-2 by FDA under an Emergency Use Authorization (EUA). This EUA will remain in effect (meaning this test can be used) for the duration of the COVID-19 declaration under Section 564(b)(1) of the Act, 21 U.S.C. section 360bbb-3(b)(1), unless the authorization is terminated or revoked.  Performed at [facility], [address] [place], [place] [address]    DG Chest 2 View  Result Date: 07/22/2023 CLINICAL DATA:  Shortness of breath for 3 days EXAM: CHEST - 2 VIEW COMPARISON:  Chest x-ray 10/29/2022. FINDINGS: Underinflated and under penetrated radiographs limits evaluation. Enlarged cardiopericardial silhouette with vascular congestion. Question tiny effusions. No obvious consolidation or pneumothorax. Overlapping cardiac leads. IMPRESSION: Limited x-rays.  Enlarged heart with vascular congestion. Electronically Signed   By: Karen Kays M.D.   On: 07/22/2023 18:36    Pending Labs Unresulted Labs (From admission, onward)     Start     Ordered   07/29/23 0500  Creatinine, serum  (enoxaparin (LOVENOX)    CrCl >/= 30 ml/min)  Weekly,   R     Comments: while on enoxaparin therapy    07/22/23 2134   07/23/23 0500  HIV Antibody (routine testing w rflx)  (HIV Antibody (Routine testing w reflex) panel)  Tomorrow morning,   R        07/22/23 2134   07/23/23 0500  Basic metabolic panel  Daily,   R      07/22/23 2134   07/23/23 0500  Magnesium  Tomorrow morning,   R        07/22/23 2134   07/23/23 0500  CBC  Daily,   R      07/22/23 2134   07/22/23 2152  Expectorated Sputum Assessment w Gram Stain, Rflx to Resp Cult  Once,   R        07/22/23 2152  Vitals/Pain Today's Vitals   07/22/23 1739 07/22/23 1815 07/22/23 1930 07/22/23 2148  BP:  (!) 178/91 (!) 159/96 (!) 159/96  Pulse:  89 82   Resp:  18 18   Temp:  98.2 F (36.8 C)    TempSrc:  Oral    SpO2:  92% 94%   Weight:      Height:      PainSc: 0-No pain       Isolation Precautions No active isolations  Medications Medications  amLODipine (NORVASC) tablet 10 mg (10 mg Oral Given 07/22/23 2148)  carvedilol (COREG) tablet 12.5 mg (has no administration in time range)  rosuvastatin (CRESTOR) tablet 40 mg (has no administration in time range)  mometasone-formoterol (DULERA) 100-5 MCG/ACT inhaler 2 puff (has no administration in time range)  enoxaparin (LOVENOX) injection 40 mg (has no administration in time range)  labetalol (NORMODYNE) injection 10 mg (has no administration in time range)  sodium chloride flush (NS) 0.9 % injection 3 mL (has no administration in time range)  ibuprofen (ADVIL) tablet 400 mg (has no administration in time range)  oxyCODONE (Oxy IR/ROXICODONE) immediate release tablet 5 mg (has no administration in time range)  polyethylene glycol (MIRALAX / GLYCOLAX) packet 17 g (has no administration in time range)  ondansetron (ZOFRAN) tablet 4 mg (has no administration in time range)    Or  ondansetron (ZOFRAN) injection 4 mg (has no administration in time range)  irbesartan (AVAPRO) tablet 300 mg (has no administration in time range)  hydrochlorothiazide (HYDRODIURIL) tablet 25 mg (has no administration in time range)  methylPREDNISolone sodium succinate (SOLU-MEDROL) 125 mg/2 mL injection 125 mg (has no administration in time range)  ipratropium-albuterol (DUONEB) 0.5-2.5 (3) MG/3ML nebulizer solution 3 mL (has no administration in time range)  albuterol (PROVENTIL) (2.5 MG/3ML) 0.083% nebulizer solution 2.5 mg (has no administration in time range)  cefTRIAXone (ROCEPHIN) 1 g in sodium chloride 0.9 % 100 mL IVPB (has no administration in time range)   ondansetron (ZOFRAN) injection 4 mg (4 mg Intravenous Given 07/22/23 1759)  sodium chloride 0.9 % bolus 1,000 mL (0 mLs Intravenous Stopped 07/22/23 1900)  ipratropium-albuterol (DUONEB) 0.5-2.5 (3) MG/3ML nebulizer solution 3 mL (3 mLs Nebulization Given 07/22/23 1835)  methylPREDNISolone sodium succinate (SOLU-MEDROL) 125 mg/2 mL injection 125 mg (125 mg Intravenous Given 07/22/23 2148)    Mobility walks

## 2023-07-22 NOTE — ED Triage Notes (Signed)
Patient reports cough, nausea, vomiting, and SOB x 3 days. Denies chest pain and fevers. Has been unable to take his BP medications. BP 202/112 at time of triage.

## 2023-07-22 NOTE — ED Notes (Signed)
Pt placed on 2 liters due to RA being 89%

## 2023-07-22 NOTE — ED Notes (Signed)
Patient transported to X-ray 

## 2023-07-22 NOTE — ED Provider Notes (Signed)
[place] EMERGENCY DEPARTMENT AT [facility] Provider Note   CSN: [id number] Arrival date & time: 07/22/23  1727     History  Chief Complaint  Patient presents with   Cough   Shortness of Breath    Cristian Anderson is a 47 y.o. male.  Morbidly obese 47 year old male presenting emergency department for shortness of breath and cough.  Reports symptoms for the past 2 to 3 days seemingly worsening.  No nausea, but has reported some vomiting.  Seemingly describing posttussive emesis type vomiting.  No chest pain or palpitations.  Has not taken his blood pressure medications today.   Cough Associated symptoms: shortness of breath   Shortness of Breath Associated symptoms: cough        Home Medications Prior to Admission medications   Medication Sig Start Date End Date Taking? Authorizing Provider  albuterol (VENTOLIN HFA) 108 (90 Base) MCG/ACT inhaler Inhale 1-2 puffs into the lungs every 4 (four) hours as needed for wheezing or shortness of breath. 04/08/23   Anders Simmonds, PA-C  amLODipine (NORVASC) 10 MG tablet Take 1 tablet (10 mg total) by mouth daily. 04/08/23   Anders Simmonds, PA-C  Blood Pressure Monitor DEVI Please provide patient with insurance approved blood pressure . Device broken. 06/22/22   Claiborne Rigg, NP  carvedilol (COREG) 12.5 MG tablet Take 1 tablet (12.5 mg total) by mouth 2 (two) times daily with a meal. 04/08/23   McClung, Marzella Schlein, PA-C  FLUoxetine (PROZAC) 20 MG capsule Take 20 mg by mouth 4 (four) times daily.    [provider]  rosuvastatin (CRESTOR) 40 MG tablet Take 1 tablet (40 mg total) by mouth daily. 04/08/23   Anders Simmonds, PA-C  valsartan-hydrochlorothiazide (DIOVAN-HCT) 320-25 MG tablet Take 1 tablet by mouth daily. 04/08/23   Anders Simmonds, PA-C  carvedilol (COREG) 6.25 MG tablet Take 1 tablet (6.25 mg total) by mouth 2 (two) times daily with a meal. 07/31/22   Claiborne Rigg, NP      Allergies     Acetaminophen and Penicillins    Review of Systems   Review of Systems  Respiratory:  Positive for cough and shortness of breath.     Physical Exam Updated Vital Signs BP (!) 159/96   Pulse 82   Temp 98.2 F (36.8 C) (Oral)   Resp 18   Ht 5\' 8"  (1.727 m)   Wt (!) 166 kg   SpO2 94%   BMI 55.65 kg/m  Physical Exam Vitals and nursing note reviewed.  Constitutional:      Appearance: He is obese.  HENT:     Nose: Nose normal.  Eyes:     Conjunctiva/sclera: Conjunctivae normal.  Cardiovascular:     Rate and Rhythm: Normal rate and regular rhythm.  Pulmonary:     Effort: Pulmonary effort is normal.     Breath sounds: Wheezing (mild) and rhonchi (mild) present.  Abdominal:     General: Abdomen is flat. There is no distension.     Tenderness: There is no abdominal tenderness. There is no guarding or rebound.  Musculoskeletal:        General: Normal range of motion.     Right lower leg: Edema present.     Left lower leg: Edema present.  Skin:    General: Skin is warm.     Capillary Refill: Capillary refill takes less than 2 seconds.  Neurological:     Mental Status: He is oriented to person, place,  and time.  Psychiatric:        Mood and Affect: Mood normal.        Behavior: Behavior normal.     ED Results / Procedures / Treatments   Labs (all labs ordered are listed, but only abnormal results are displayed) Labs Reviewed  COMPREHENSIVE METABOLIC PANEL - Abnormal; Notable for the following components:      Result Value   Sodium 134 (*)    Glucose, Bld 118 (*)    Calcium 8.7 (*)    ALT 50 (*)    All other components within normal limits  RESP PANEL BY RT-PCR (RSV, FLU A&B, COVID)  [id number]  CBC  LIPASE, BLOOD  D-DIMER, QUANTITATIVE  BRAIN NATRIURETIC PEPTIDE  TROPONIN I (HIGH SENSITIVITY)  TROPONIN I (HIGH SENSITIVITY)    EKG EKG Interpretation Date/Time:  Thursday July 22 2023 17:36:02 EST Ventricular Rate:  89 PR Interval:  173 QRS  Duration:  75 QT Interval:  339 QTC Calculation: 413 R Axis:   64  Text Interpretation: Sinus rhythm Nonspecific T abnormalities, lateral leads ST elevation, consider inferior injury Baseline wander in lead(s) V1 Confirmed by Estanislado Pandy [phone]) on 07/22/2023 7:01:29 PM  Radiology DG Chest 2 View  Result Date: 07/22/2023 CLINICAL DATA:  Shortness of breath for 3 days EXAM: CHEST - 2 VIEW COMPARISON:  Chest x-ray 10/29/2022. FINDINGS: Underinflated and under penetrated radiographs limits evaluation. Enlarged cardiopericardial silhouette with vascular congestion. Question tiny effusions. No obvious consolidation or pneumothorax. Overlapping cardiac leads. IMPRESSION: Limited x-rays.  Enlarged heart with vascular congestion. Electronically Signed   By: Karen Kays M.D.   On: 07/22/2023 18:36    Procedures Procedures    Medications Ordered in ED Medications  ondansetron (ZOFRAN) injection 4 mg (4 mg Intravenous Given 07/22/23 1759)  sodium chloride 0.9 % bolus 1,000 mL (0 mLs Intravenous Stopped 07/22/23 1900)  ipratropium-albuterol (DUONEB) 0.5-2.5 (3) MG/3ML nebulizer solution 3 mL (3 mLs Nebulization Given 07/22/23 1835)    ED Course/ Medical Decision Making/ A&P Clinical Course as of 07/22/23 2107  Thu Jul 22, 2023  1742 Per chart review from PCP visit on 11/19 :"He has a past medical history of Anxiety attack, Bipolar 1 disorder Fatty liver, GERD, Hypertension, Obesity, Panic attack, PTSD, and Schizophrenia. "  Appears to be on numerous anti-hypertensives.  [TY]  1815 CBC No leukocytosis to suggest systemic infection [TY]  1846 D-Dimer, Quant: 0.45 PE/DVT less likely [TY]  1846 Troponin I (High Sensitivity): 8 ACS less likely.  [TY]  1847 DG Chest 2 View IMPRESSION: Limited x-rays.  Enlarged heart with vascular congestion.   [TY]  2106 Resp panel by RT-PCR (RSV, Flu A&B, Covid) Anterior Nasal Swab No evidence of viral infection [TY]  2106 Final him; patient continues to  require oxygen saturating in the low 90s on 2 L nasal cannula.  Does not appear to be in respiratory distress.  COPD exacerbation?  Doubt particularly wheezing and breath sounds more crackly.  Does have some lower extremity edema.  Question cardiac etiology secondary to patient's elevated blood pressure?  Pulmonary edema?.  Does not appear to have signs of infectious process/pneumonia.  Trial DuoNeb with minor improvement in his shortness of breath.  Given patient's new oxygen requirements will admit for further evaluation. [TY]    Clinical Course User Index [TY] Coral Spikes, DO  Medical Decision Making Is a 47 year old male presenting emergency department for shortness of breath cough.  He is hypertensive.  Afebrile nontachycardic maintaining his oxygen saturation in the mid 90s on room air.  Does not appear to be in significant respiratory distress on exam.  Patient wheezing/rhonchi noted.  Will trial DuoNeb's as he does have albuterol on his home medications and reports history of asthma.  He seemingly is otherwise asymptomatic from elevated blood pressure.  He does note he also has some congestion; question viral etiology.  Will get flu/COVID/RSV.  Chest x-ray to evaluate for pneumonia.  Will get screening labs as well.  Amount and/or Complexity of Data Reviewed Independent Historian:     Details: Daughter notes elevated blood pressure at home External Data Reviewed:     Details: Per chart review admitted in march of this year. According to d/c summary :"admitted for acute hypoxia in the setting of CAP, and possibly concurrent COPD exacerbation. Patient's mother reports he had previously been diagnosed with COPD which was not previously reported to our system. Patient improved on antibiotics, breo, and nebs. Sats now well into the mid 90s while ambulating on room air. Patient no longer has any symptoms of dyspnea. Close follow up with PCP in the next week to two  weeks. Might benefit from pulmonology evaluation." Labs: ordered. Decision-making details documented in ED Course. Radiology: ordered. Decision-making details documented in ED Course. ECG/medicine tests: ordered. Decision-making details documented in ED Course.  Risk Prescription drug management. Decision regarding hospitalization. Diagnosis or treatment significantly limited by social determinants of health. Risk Details: Poor health literacy.           Final Clinical Impression(s) / ED Diagnoses Final diagnoses:  Acute hypoxic respiratory failure [facility])    Rx / DC Orders ED Discharge Orders     None         Coral Spikes, DO 07/22/23 2107

## 2023-07-22 NOTE — H&P (Signed)
History and Physical    Jonathen Callum NWG:[id number] DOB: Jul 26, 1976 DOA: 07/22/2023  PCP: Claiborne Rigg, NP   Patient coming from: Home   Chief Complaint: SOB, cough   HPI: Cristian Anderson is a 47 y.o. male with medical history significant for hypertension, OSA, BMI of 56, has bipolar 1 disorder and schizophrenia and his problem list, and presents with cough and shortness of breath.  Patient reports a couple days of severe cough with sputum production and posttussive emesis.  He has become progressively more short of breath.  He has bilateral lower extremity edema which he states comes and goes periodically.  He has not been taking his antihypertensives. He denies any chest pain, headache, change in vision or hearing, focal numbness or weakness, fever, or chills.    ED Course: Upon arrival to the ED, patient is found to be afebrile and saturating upper 80s on room air with normal heart rate and severely elevated blood pressure.  Chest x-ray notable for cardiomegaly and vascular congestion.  Labs are most notable for normal WBC, normal troponin, and normal BNP.  Patient was treated in the ED with 125 mg IV Solu-Medrol, 1 L normal saline, Zofran, and DuoNeb.  Review of Systems:  All other systems reviewed and apart from HPI, are negative.  Past Medical History:  Diagnosis Date   Anxiety attack    Bipolar 1 disorder (HCC)    Fatty liver    GERD (gastroesophageal reflux disease)    Hypertension    Obesity    Panic attack    PTSD (post-traumatic stress disorder)    Schizophrenia (HCC)     History reviewed. No pertinent surgical history.  Social History:   reports that he has been smoking cigarettes. He has never used smokeless tobacco. He reports that he does not drink alcohol and does not use drugs.  Allergies  Allergen Reactions   Acetaminophen Itching and Rash   Penicillins Itching and Rash    Family History  Problem Relation Age of Onset   Diabetes Father    Colon polyps  Brother    Heart disease Maternal Aunt      Prior to Admission medications   Medication Sig Start Date End Date Taking? Authorizing Provider  albuterol (VENTOLIN HFA) 108 (90 Base) MCG/ACT inhaler Inhale 1-2 puffs into the lungs every 4 (four) hours as needed for wheezing or shortness of breath. 04/08/23  Yes Georgian Co M, PA-C  amLODipine (NORVASC) 10 MG tablet Take 1 tablet (10 mg total) by mouth daily. 04/08/23  Yes Georgian Co M, PA-C  carvedilol (COREG) 12.5 MG tablet Take 1 tablet (12.5 mg total) by mouth 2 (two) times daily with a meal. 04/08/23  Yes McClung, Angela M, PA-C  rosuvastatin (CRESTOR) 40 MG tablet Take 1 tablet (40 mg total) by mouth daily. 04/08/23  Yes McClung, Angela M, PA-C  valsartan-hydrochlorothiazide (DIOVAN-HCT) 320-25 MG tablet Take 1 tablet by mouth daily. 04/08/23  Yes Anders Simmonds, PA-C  Blood Pressure Monitor DEVI Please provide patient with insurance approved blood pressure . Device broken. 06/22/22   Claiborne Rigg, NP  carvedilol (COREG) 6.25 MG tablet Take 1 tablet (6.25 mg total) by mouth 2 (two) times daily with a meal. 07/31/22   Claiborne Rigg, NP    Physical Exam: Vitals:   07/22/23 1737 07/22/23 1815 07/22/23 1930 07/22/23 2148  BP:  (!) 178/91 (!) 159/96 (!) 159/96  Pulse:  89 82   Resp:  18 18   Temp:  98.2  F (36.8 C)    TempSrc:  Oral    SpO2:  92% 94%   Weight: (!) 166 kg     Height: 5\' 8"  (1.727 m)       Constitutional: NAD, no pallor or diaphoresis  Eyes: PERTLA, lids and conjunctivae normal ENMT: Mucous membranes are moist. Posterior pharynx clear of any exudate or lesions.   Neck: supple, no masses  Respiratory: Diminished bilaterally with prolonged expiratory phase. Speaking full sentences.  Cardiovascular: S1 & S2 heard, regular rate and rhythm. Pretibial pitting edema bilaterally.   Abdomen: no tenderness, soft. Bowel sounds active.  Musculoskeletal: no clubbing / cyanosis. No joint deformity upper and lower  extremities.   Skin: no significant rashes, lesions, ulcers. Warm, dry, well-perfused. Neurologic: CN 2-12 grossly intact. Moving all extremities. Alert and oriented.  Psychiatric: Calm. Cooperative.    Labs and Imaging on Admission: I have personally reviewed following labs and imaging studies  CBC: Recent Labs  Lab 07/22/23 1755  WBC 9.7  HGB 15.5  HCT 46.1  MCV 90.4  PLT 206   Basic Metabolic Panel: Recent Labs  Lab 07/22/23 1755  NA 134*  K 3.5  CL 99  CO2 26  GLUCOSE 118*  BUN 14  CREATININE 1.06  CALCIUM 8.7*   GFR: Estimated Creatinine Clearance: 129.5 mL/min (by C-G formula based on SCr of 1.06 mg/dL). Liver Function Tests: Recent Labs  Lab 07/22/23 1755  AST 38  ALT 50*  ALKPHOS 54  BILITOT 0.8  PROT 7.7  ALBUMIN 4.1   Recent Labs  Lab 07/22/23 1755  LIPASE 30   No results for input(s): "AMMONIA" in the last 168 hours. Coagulation Profile: No results for input(s): "INR", "PROTIME" in the last 168 hours. Cardiac Enzymes: No results for input(s): "CKTOTAL", "CKMB", "CKMBINDEX", "TROPONINI" in the last 168 hours. BNP (last 3 results) No results for input(s): "PROBNP" in the last 8760 hours. HbA1C: No results for input(s): "HGBA1C" in the last 72 hours. CBG: No results for input(s): "GLUCAP" in the last 168 hours. Lipid Profile: No results for input(s): "CHOL", "HDL", "LDLCALC", "TRIG", "CHOLHDL", "LDLDIRECT" in the last 72 hours. Thyroid Function Tests: No results for input(s): "TSH", "T4TOTAL", "FREET4", "T3FREE", "THYROIDAB" in the last 72 hours. Anemia Panel: No results for input(s): "VITAMINB12", "FOLATE", "FERRITIN", "TIBC", "IRON", "RETICCTPCT" in the last 72 hours. Urine analysis:    Component Value Date/Time   COLORURINE YELLOW 09/14/2016 1658   APPEARANCEUR HAZY (A) 09/14/2016 1658   LABSPEC 1.039 (H) 09/14/2016 1658   PHURINE 6.0 09/14/2016 1658   GLUCOSEU NEGATIVE 09/14/2016 1658   HGBUR SMALL (A) 09/14/2016 1658    BILIRUBINUR NEGATIVE 09/14/2016 1658   KETONESUR NEGATIVE 09/14/2016 1658   PROTEINUR NEGATIVE 09/14/2016 1658   UROBILINOGEN 1.0 12/30/2011 1530   NITRITE NEGATIVE 09/14/2016 1658   LEUKOCYTESUR NEGATIVE 09/14/2016 1658   Sepsis Labs: @LABRCNTIP (procalcitonin:4,lacticidven:4) ) Recent Results (from the past 240 hour(s))  Resp panel by RT-PCR (RSV, Flu A&B, Covid) Anterior Nasal Swab     Status: None   Collection Time: 07/22/23  6:48 PM   Specimen: Anterior Nasal Swab  Result Value Ref Range Status   SARS Coronavirus 2 by RT PCR NEGATIVE NEGATIVE Final    Comment: (NOTE) SARS-CoV-2 target nucleic acids are NOT DETECTED.  The SARS-CoV-2 RNA is generally detectable in upper respiratory specimens during the acute phase of infection. The lowest concentration of SARS-CoV-2 viral copies this assay can detect is 138 copies/mL. A negative result does not preclude SARS-Cov-2 infection and should not  be used as the sole basis for treatment or other patient management decisions. A negative result may occur with  improper specimen collection/handling, submission of specimen other than nasopharyngeal swab, presence of viral mutation(s) within the areas targeted by this assay, and inadequate number of viral copies(<138 copies/mL). A negative result must be combined with clinical observations, patient history, and epidemiological information. The expected result is Negative.  Fact Sheet for Patients:  BloggerCourse.com  Fact Sheet for Healthcare Providers:  SeriousBroker.it  This test is no t yet approved or cleared by the [place] FDA and  has been authorized for detection and/or diagnosis of SARS-CoV-2 by FDA under an Emergency Use Authorization (EUA). This EUA will remain  in effect (meaning this test can be used) for the duration of the COVID-19 declaration under Section 564(b)(1) of the Act, 21 U.S.C.section 360bbb-3(b)(1), unless  the authorization is terminated  or revoked sooner.       Influenza A by PCR NEGATIVE NEGATIVE Final   Influenza B by PCR NEGATIVE NEGATIVE Final    Comment: (NOTE) The Xpert Xpress SARS-CoV-2/FLU/RSV plus assay is intended as an aid in the diagnosis of influenza from Nasopharyngeal swab specimens and should not be used as a sole basis for treatment. Nasal washings and aspirates are unacceptable for Xpert Xpress SARS-CoV-2/FLU/RSV testing.  Fact Sheet for Patients: BloggerCourse.com  Fact Sheet for Healthcare Providers: SeriousBroker.it  This test is not yet approved or cleared by the [place] FDA and has been authorized for detection and/or diagnosis of SARS-CoV-2 by FDA under an Emergency Use Authorization (EUA). This EUA will remain in effect (meaning this test can be used) for the duration of the COVID-19 declaration under Section 564(b)(1) of the Act, 21 U.S.C. section 360bbb-3(b)(1), unless the authorization is terminated or revoked.     Resp Syncytial Virus by PCR NEGATIVE NEGATIVE Final    Comment: (NOTE) Fact Sheet for Patients: BloggerCourse.com  Fact Sheet for Healthcare Providers: SeriousBroker.it  This test is not yet approved or cleared by the [place] FDA and has been authorized for detection and/or diagnosis of SARS-CoV-2 by FDA under an Emergency Use Authorization (EUA). This EUA will remain in effect (meaning this test can be used) for the duration of the COVID-19 declaration under Section 564(b)(1) of the Act, 21 U.S.C. section 360bbb-3(b)(1), unless the authorization is terminated or revoked.  Performed at [facility], [address], [place], [place] [address]      Radiological Exams on Admission: DG Chest 2 View  Result Date: 07/22/2023 CLINICAL DATA:  Shortness of breath for 3 days EXAM: CHEST - 2 VIEW COMPARISON:   Chest x-ray 10/29/2022. FINDINGS: Underinflated and under penetrated radiographs limits evaluation. Enlarged cardiopericardial silhouette with vascular congestion. Question tiny effusions. No obvious consolidation or pneumothorax. Overlapping cardiac leads. IMPRESSION: Limited x-rays.  Enlarged heart with vascular congestion. Electronically Signed   By: Karen Kays M.D.   On: 07/22/2023 18:36    EKG: Independently reviewed. Sinus rhythm.   Assessment/Plan   1. Acute hypoxic respiratory failure; suspected COPD exacerbation  - Culture sputum, continue systemic steroid, start azithromycin, continue supplemental O2 as needed, schedule DuoNebs, and use additional albuterol as needed   2. Hypertensive urgency  - BP as high as 202/112 without target organ damage  - Resume home antihypertensives with dose now, use labetalol as needed    3. OSA  - CPAP while sleeping    DVT prophylaxis: Lovenox  Code Status: Full  Level of Care: Level of care: Telemetry  Family Communication: None present  Disposition Plan:  Patient is from: Home  Anticipated d/c is to: Home  Anticipated d/c date is: 11/29 or 07/24/23  Patient currently: Pending improved respiratory status  Consults called: None  Admission status: Observation     Briscoe Deutscher, MD [facility]  07/22/2023, 9:52 PM

## 2023-07-23 ENCOUNTER — Observation Stay (HOSPITAL_BASED_OUTPATIENT_CLINIC_OR_DEPARTMENT_OTHER): Payer: BLUE CROSS/BLUE SHIELD

## 2023-07-23 DIAGNOSIS — R0609 Other forms of dyspnea: Secondary | ICD-10-CM | POA: Diagnosis not present

## 2023-07-23 DIAGNOSIS — G4733 Obstructive sleep apnea (adult) (pediatric): Secondary | ICD-10-CM | POA: Diagnosis not present

## 2023-07-23 DIAGNOSIS — J9601 Acute respiratory failure with hypoxia: Secondary | ICD-10-CM | POA: Diagnosis not present

## 2023-07-23 DIAGNOSIS — I160 Hypertensive urgency: Secondary | ICD-10-CM | POA: Diagnosis not present

## 2023-07-23 LAB — ECHOCARDIOGRAM COMPLETE
Area-P 1/2: 2.7 cm2
Height: 68 in
S' Lateral: 2.8 cm
Weight: 5856.02 [oz_av]

## 2023-07-23 LAB — BASIC METABOLIC PANEL
Anion gap: 10 (ref 5–15)
BUN: 15 mg/dL (ref 6–20)
CO2: 27 mmol/L (ref 22–32)
Calcium: 8.8 mg/dL — ABNORMAL LOW (ref 8.9–10.3)
Chloride: 100 mmol/L (ref 98–111)
Creatinine, Ser: 1.28 mg/dL — ABNORMAL HIGH (ref 0.61–1.24)
GFR, Estimated: >60 mL/min (ref >60)
Glucose, Bld: 158 mg/dL — ABNORMAL HIGH (ref 70–99)
Potassium: 4.8 mmol/L (ref 3.5–5.1)
Sodium: 137 mmol/L (ref 135–145)

## 2023-07-23 LAB — MAGNESIUM: Magnesium: 2.8 mg/dL — ABNORMAL HIGH (ref 1.7–2.4)

## 2023-07-23 LAB — BRAIN NATRIURETIC PEPTIDE: B Natriuretic Peptide: 44.1 pg/mL (ref 0.0–100.0)

## 2023-07-23 LAB — CBC
HCT: 52.4 % — ABNORMAL HIGH (ref 39.0–52.0)
Hemoglobin: 16.7 g/dL (ref 13.0–17.0)
MCH: 30 pg (ref 26.0–34.0)
MCHC: 31.9 g/dL (ref 30.0–36.0)
MCV: 94.2 fL (ref 80.0–100.0)
Platelets: 226 10*3/uL (ref 150–400)
RBC: 5.56 MIL/uL (ref 4.22–5.81)
RDW: 13.7 % (ref 11.5–15.5)
WBC: 11.5 10*3/uL — ABNORMAL HIGH (ref 4.0–10.5)
nRBC: 0 % (ref 0.0–0.2)

## 2023-07-23 LAB — HIV ANTIBODY (ROUTINE TESTING W REFLEX): HIV Screen 4th Generation wRfx: NONREACTIVE

## 2023-07-23 MED ORDER — ENOXAPARIN SODIUM 80 MG/0.8ML IJ SOSY
80.0000 mg | PREFILLED_SYRINGE | INTRAMUSCULAR | Status: DC
  Filled 2023-07-23: qty 0.8

## 2023-07-23 MED ORDER — IPRATROPIUM-ALBUTEROL 0.5-2.5 (3) MG/3ML IN SOLN
3.0000 mL | Freq: Three times a day (TID) | RESPIRATORY_TRACT | Status: DC
  Administered 2023-07-23 – 2023-07-24 (×4): 3 mL via RESPIRATORY_TRACT
  Filled 2023-07-23 (×3): qty 3

## 2023-07-23 MED ORDER — FUROSEMIDE 10 MG/ML IJ SOLN
40.0000 mg | Freq: Once | INTRAMUSCULAR | Status: AC
  Administered 2023-07-23: 40 mg via INTRAVENOUS
  Filled 2023-07-23: qty 4

## 2023-07-23 MED ORDER — PERFLUTREN LIPID MICROSPHERE
1.0000 mL | INTRAVENOUS | Status: AC | PRN
  Administered 2023-07-23: 4 mL via INTRAVENOUS

## 2023-07-23 NOTE — Progress Notes (Signed)
Patient removed his telemetry monitor and refused to let the Nurse or Charge Nurse re-attach. He also refused to weigh.

## 2023-07-23 NOTE — Plan of Care (Signed)
  Problem: Health Behavior/Discharge Planning: Goal: Ability to manage health-related needs will improve Outcome: Progressing   Problem: Clinical Measurements: Goal: Ability to maintain clinical measurements within normal limits will improve Outcome: Progressing Goal: Will remain free from infection Outcome: Progressing Goal: Diagnostic test results will improve Outcome: Progressing Goal: Respiratory complications will improve Outcome: Progressing   Problem: Coping: Goal: Level of anxiety will decrease Outcome: Progressing   Problem: Pain Management: Goal: General experience of comfort will improve Outcome: Progressing

## 2023-07-23 NOTE — Progress Notes (Signed)
   07/23/23 1941  BiPAP/CPAP/SIPAP  BiPAP/CPAP/SIPAP Pt Type Adult (prefers self placement)  BiPAP/CPAP/SIPAP DREAMSTATIOND  Mask Type Full face mask  Mask Size Medium  Flow Rate 1 lpm  Patient Home Equipment No  Auto Titrate Yes (5-20)  CPAP/SIPAP surface wiped down Yes  BiPAP/CPAP /SiPAP Vitals  Pulse Rate 86  Resp 18  SpO2 91 %  Bilateral Breath Sounds Diminished;Expiratory wheezes  MEWS Score/Color  MEWS Score 0  MEWS Score Color Cristian Anderson

## 2023-07-23 NOTE — Progress Notes (Signed)
Patient Refused The Skin Assessment. He stated that his skin is  "intact."

## 2023-07-23 NOTE — Progress Notes (Addendum)
PROGRESS NOTE    Cristian Anderson  WGN:[id number] DOB: 1975/09/11 DOA: 07/22/2023 PCP: Claiborne Rigg, NP    Brief Narrative:   Cristian Anderson is a 47 y.o. male with medical history significant for hypertension, OSA, morbid obesity, bipolar disorder and schizophrenia presented to hospital with cough and shortness of breath for couple of days with some post-tussive emesis.  He also complained of bilateral lower extremity edema with progressive shortness of breath.  Has not been taking his antihypertensives.  In the ED, patient was noted to be  afebrile and saturating upper 80s on room air with normal heart rate and severely elevated blood pressure.  Chest x-ray notable for cardiomegaly and vascular congestion.  Labs were within normal range.  In the ED, patient received 125 mg IV Solu-Medrol, 1 L normal saline, Zofran, and DuoNeb and was admitted hospital for further evaluation and treatment.  Assessment and plan.  Acute hypoxic respiratory failure; likely secondary to suspected COPD exacerbation  Continues to smoke.  Counseling done.  Continue oxygen, systemic steroids, inhaled steroids bronchodilators.  Follow sputum culture.  On 2 L of oxygen by nasal cannula.  Tmax of 99.2 F.  HIV nonreactive.  COVID influenza and RSV was negative.  D-dimer 0.4.  EKG done in the ED showed normal sinus rhythm.  Uncertain cardiac etiology.  Will get BNP and check 2D echocardiogram.  Efforts at ambulation today with pulse ox of 84% on ambulation.  Will give 1 dose of IV Lasix since patient has peripheral edema with dyspnea.  Mild leukocytosis.  Reactive secondary to steroid.   Hypertensive urgency  - BP as high as 202/112 without target organ damage.  Was noncompliant with medication for the last few days as per the patient.Marland Kitchen  Has been resumed on amlodipine, hydrochlorothiazide, Avapro.  Blood pressure still accelerated.  Will need better control.  Will continue to monitor closely.    OSA  Continue CPAP at nighttime.   Will continue  Morbid obesity. Body mass index is 55.65 kg/m.  Would benefit from weight loss as outpatient.  Lifestyle modification discussed.  History of bipolar disorder. Not on medications.    DVT prophylaxis: Lovenox subcu   Code Status:     Code Status: Full Code  Disposition: Home likely 07/24/2023 Status is: Observation The patient will require care spanning > 2 midnights and should be moved to inpatient because: N/A with hypoxic respiratory failure, cardiac workup, pending clinical improvement   Family Communication: None at bedside  Consultants:  None  Procedures:  None  Antimicrobials:  Zithromax  Anti-infectives (From admission, onward)    Start     Dose/Rate Route Frequency Ordered Stop   07/22/23 2200  cefTRIAXone (ROCEPHIN) 1 g in sodium chloride 0.9 % 100 mL IVPB  Status:  Discontinued        1 g 200 mL/hr over 30 Minutes Intravenous Every 24 hours 07/22/23 2152 07/22/23 2153   07/22/23 2200  azithromycin (ZITHROMAX) tablet 500 mg        500 mg Oral Daily 07/22/23 2153 07/25/23 0959        Subjective: Today, patient was seen and examined at bedside.  Patient states that he feels better with breathing.  States that breathing treatment here with above helped him.  Has leg swelling but is not on diuretic.  CPAP at nighttime.  Denies any chest pain, no fever chills or rigor.  Still has shortness of breath and desaturated on ambulation at 84%.  Objective: Vitals:   07/23/23 0228 07/23/23  0559 07/23/23 0606 07/23/23 0845  BP: (!) 164/100 (!) 167/113    Pulse: 83 96    Resp: 19 19    Temp: 97.8 F (36.6 C) 98.8 F (37.1 C)    TempSrc: Oral Oral    SpO2: 96% 96% 97% 97%  Weight:      Height:        Intake/Output Summary (Last 24 hours) at 07/23/2023 1121 Last data filed at 07/23/2023 0100 Gross per 24 hour  Intake 543 ml  Output --  Net 543 ml   Filed Weights   07/22/23 1737  Weight: (!) 166 kg    Physical Examination: Body mass index  is 55.65 kg/m.   General: Morbid obese built, not in obvious distress, on supplemental oxygen by nasal cannula HENT:   No scleral pallor or icterus noted. Oral mucosa is moist.  Chest:   Diminished breath sounds bilaterally with prolonged expiration.. No crackles or wheezes.  CVS: S1 &S2 heard. No murmur.  Regular rate and rhythm. Abdomen: Soft, nontender, nondistended.  Bowel sounds are heard.   Extremities: No cyanosis, clubbing or edema.  Peripheral pulses are palpable. Psych: Alert, awake and oriented, normal mood CNS:  No cranial nerve deficits.  Power equal in all extremities.   Skin: Warm and dry.  No rashes noted.  Data Reviewed:   CBC: Recent Labs  Lab 07/22/23 1755 07/23/23 0538  WBC 9.7 11.5*  HGB 15.5 16.7  HCT 46.1 52.4*  MCV 90.4 94.2  PLT 206 226    Basic Metabolic Panel: Recent Labs  Lab 07/22/23 1755 07/23/23 0538  NA 134* 137  K 3.5 4.8  CL 99 100  CO2 26 27  GLUCOSE 118* 158*  BUN 14 15  CREATININE 1.06 1.28*  CALCIUM 8.7* 8.8*  MG  --  2.8*    Liver Function Tests: Recent Labs  Lab 07/22/23 1755  AST 38  ALT 50*  ALKPHOS 54  BILITOT 0.8  PROT 7.7  ALBUMIN 4.1     Radiology Studies: DG Chest 2 View  Result Date: 07/22/2023 CLINICAL DATA:  Shortness of breath for 3 days EXAM: CHEST - 2 VIEW COMPARISON:  Chest x-ray 10/29/2022. FINDINGS: Underinflated and under penetrated radiographs limits evaluation. Enlarged cardiopericardial silhouette with vascular congestion. Question tiny effusions. No obvious consolidation or pneumothorax. Overlapping cardiac leads. IMPRESSION: Limited x-rays.  Enlarged heart with vascular congestion. Electronically Signed   By: Karen Kays M.D.   On: 07/22/2023 18:36      LOS: 0 days     Joycelyn Das, MD [facility] Available via Epic secure chat 7am-7pm After these hours, please refer to coverage provider listed on [email or web address] 07/23/2023, 11:21 AM

## 2023-07-23 NOTE — Progress Notes (Signed)
   07/23/23 1150  TOC Brief Assessment  Insurance and Status Reviewed  Patient has primary care physician Yes  Home environment has been reviewed single family home  Prior level of function: independent  Prior/Current Home Services No current home services  Social Determinants of Health Reivew SDOH reviewed no interventions necessary  Readmission risk has been reviewed Yes  Transition of care needs transition of care needs identified, TOC will continue to follow    TOC will follow for possible home O2 need.

## 2023-07-23 NOTE — Progress Notes (Signed)
  Echocardiogram 2D Echocardiogram has been performed.  Cristian Anderson 07/23/2023, 12:17 PM

## 2023-07-23 NOTE — Hospital Course (Signed)
Cristian Anderson is a 47 y.o. male with medical history significant for hypertension, OSA, morbid obesity, bipolar disorder and schizophrenia presented to hospital with cough and shortness of breath for couple of days with some post-tussive emesis.  He also complained of bilateral lower extremity edema with progressive shortness of breath.  Has not been taking his antihypertensives.  In the ED, patient was noted to be  afebrile and saturating upper 80s on room air with normal heart rate and severely elevated blood pressure.  Chest x-ray notable for cardiomegaly and vascular congestion.  Labs were within normal range.  In the ED, patient received 125 mg IV Solu-Medrol, 1 L normal saline, Zofran, and DuoNeb and was admitted hospital for further evaluation and treatment.  Assessment and plan.  .Acute hypoxic respiratory failure; likely secondary to suspected COPD exacerbation  Continues to smoke.  Continue oxygen Solu-Medrol bronchodilators.  Follow sputum culture.  On 2 L of oxygen by nasal cannula.  Tmax of 99.2 F.  HIV nonreactive.  COVID influenza and RSV was negative.  D-dimer 0.4.  EKG done in the ED showed normal sinus rhythm.  Mild leukocytosis.  Reactive secondary to steroid.   Hypertensive urgency  - BP as high as 202/112 without target organ damage.  Was noncompliant with medication.  Has been resumed on amlodipine, hydrochlorothiazide, Avapro.  Blood pressure still accelerated.    OSA  Continue CPAP at nighttime.  Morbid obesity. Body mass index is 55.65 kg/m.  Would benefit from weight loss as outpatient.  Lifestyle modification discussed.  History of bipolar disorder. Not on medications.

## 2023-07-24 ENCOUNTER — Other Ambulatory Visit: Payer: Self-pay

## 2023-07-24 ENCOUNTER — Emergency Department (HOSPITAL_COMMUNITY)
Admission: EM | Admit: 2023-07-24 | Discharge: 2023-07-25 | Disposition: A | Discharge status: Home / Self Care | Payer: BLUE CROSS/BLUE SHIELD | Source: Home / Self Care | Attending: Emergency Medicine | Admitting: Emergency Medicine

## 2023-07-24 ENCOUNTER — Encounter (HOSPITAL_COMMUNITY): Payer: Self-pay

## 2023-07-24 DIAGNOSIS — I160 Hypertensive urgency: Secondary | ICD-10-CM | POA: Diagnosis not present

## 2023-07-24 DIAGNOSIS — R202 Paresthesia of skin: Secondary | ICD-10-CM | POA: Insufficient documentation

## 2023-07-24 DIAGNOSIS — J9601 Acute respiratory failure with hypoxia: Secondary | ICD-10-CM | POA: Diagnosis not present

## 2023-07-24 DIAGNOSIS — D72829 Elevated white blood cell count, unspecified: Secondary | ICD-10-CM | POA: Insufficient documentation

## 2023-07-24 DIAGNOSIS — G4733 Obstructive sleep apnea (adult) (pediatric): Secondary | ICD-10-CM | POA: Diagnosis not present

## 2023-07-24 DIAGNOSIS — E876 Hypokalemia: Secondary | ICD-10-CM | POA: Insufficient documentation

## 2023-07-24 LAB — BASIC METABOLIC PANEL
Anion gap: 10 (ref 5–15)
Anion gap: 8 (ref 5–15)
BUN: 25 mg/dL — ABNORMAL HIGH (ref 6–20)
BUN: 25 mg/dL — ABNORMAL HIGH (ref 6–20)
CO2: 31 mmol/L (ref 22–32)
CO2: 29 mmol/L (ref 22–32)
Calcium: 9.1 mg/dL (ref 8.9–10.3)
Calcium: 8.5 mg/dL — ABNORMAL LOW (ref 8.9–10.3)
Chloride: 98 mmol/L (ref 98–111)
Chloride: 99 mmol/L (ref 98–111)
Creatinine, Ser: 1.38 mg/dL — ABNORMAL HIGH (ref 0.61–1.24)
Creatinine, Ser: 1.23 mg/dL (ref 0.61–1.24)
GFR, Estimated: >60 mL/min (ref >60)
GFR, Estimated: >60 mL/min (ref >60)
Glucose, Bld: 122 mg/dL — ABNORMAL HIGH (ref 70–99)
Glucose, Bld: 163 mg/dL — ABNORMAL HIGH (ref 70–99)
Potassium: 3.5 mmol/L (ref 3.5–5.1)
Potassium: 3.3 mmol/L — ABNORMAL LOW (ref 3.5–5.1)
Sodium: 139 mmol/L (ref 135–145)
Sodium: 136 mmol/L (ref 135–145)

## 2023-07-24 LAB — CBC
HCT: 47.9 % (ref 39.0–52.0)
Hemoglobin: 15.6 g/dL (ref 13.0–17.0)
MCH: 30.4 pg (ref 26.0–34.0)
MCHC: 32.6 g/dL (ref 30.0–36.0)
MCV: 93.2 fL (ref 80.0–100.0)
Platelets: 222 10*3/uL (ref 150–400)
RBC: 5.14 MIL/uL (ref 4.22–5.81)
RDW: 13.8 % (ref 11.5–15.5)
WBC: 17.7 10*3/uL — ABNORMAL HIGH (ref 4.0–10.5)
nRBC: 0 % (ref 0.0–0.2)

## 2023-07-24 LAB — CBC WITH DIFFERENTIAL/PLATELET
Abs Immature Granulocytes: 0.07 10*3/uL (ref 0.00–0.07)
Basophils Absolute: 0 10*3/uL (ref 0.0–0.1)
Basophils Relative: 0 %
Eosinophils Absolute: 0 10*3/uL (ref 0.0–0.5)
Eosinophils Relative: 0 %
HCT: 46.6 % (ref 39.0–52.0)
Hemoglobin: 15 g/dL (ref 13.0–17.0)
Immature Granulocytes: 1 %
Lymphocytes Relative: 21 %
Lymphs Abs: 3.1 10*3/uL (ref 0.7–4.0)
MCH: 29.5 pg (ref 26.0–34.0)
MCHC: 32.2 g/dL (ref 30.0–36.0)
MCV: 91.6 fL (ref 80.0–100.0)
Monocytes Absolute: 1.4 10*3/uL — ABNORMAL HIGH (ref 0.1–1.0)
Monocytes Relative: 9 %
Neutro Abs: 10.3 10*3/uL — ABNORMAL HIGH (ref 1.7–7.7)
Neutrophils Relative %: 69 %
Platelets: 227 10*3/uL (ref 150–400)
RBC: 5.09 MIL/uL (ref 4.22–5.81)
RDW: 13.7 % (ref 11.5–15.5)
WBC: 14.9 10*3/uL — ABNORMAL HIGH (ref 4.0–10.5)
nRBC: 0 % (ref 0.0–0.2)

## 2023-07-24 LAB — MAGNESIUM: Magnesium: 2.8 mg/dL — ABNORMAL HIGH (ref 1.7–2.4)

## 2023-07-24 LAB — TROPONIN I (HIGH SENSITIVITY): Troponin I (High Sensitivity): 8 ng/L (ref <18)

## 2023-07-24 MED ORDER — POTASSIUM CHLORIDE 10 MEQ/100ML IV SOLN: 10.0000 meq | Freq: Once | INTRAVENOUS | Status: DC

## 2023-07-24 MED ORDER — PREDNISONE 20 MG PO TABS: 40.0000 mg | ORAL_TABLET | Freq: Every day | ORAL | 0 refills | Status: AC

## 2023-07-24 MED ORDER — POTASSIUM CHLORIDE CRYS ER 10 MEQ PO TBCR
10.0000 meq | EXTENDED_RELEASE_TABLET | Freq: Once | ORAL | Status: AC
  Administered 2023-07-24: 10 meq via ORAL
  Filled 2023-07-24: qty 1

## 2023-07-24 MED ORDER — SALINE SPRAY 0.65 % NA SOLN
1.0000 | NASAL | Status: DC | PRN
  Filled 2023-07-24: qty 44

## 2023-07-24 MED ORDER — NICOTINE 14 MG/24HR TD PT24: 14.0000 mg | MEDICATED_PATCH | TRANSDERMAL | 0 refills | Status: DC

## 2023-07-24 MED ORDER — LORATADINE 10 MG PO TABS
10.0000 mg | ORAL_TABLET | Freq: Every day | ORAL | Status: DC | PRN
  Administered 2023-07-24: 10 mg via ORAL
  Filled 2023-07-24: qty 1

## 2023-07-24 MED ORDER — MOMETASONE FURO-FORMOTEROL FUM 100-5 MCG/ACT IN AERO: 2.0000 | INHALATION_SPRAY | Freq: Two times a day (BID) | RESPIRATORY_TRACT | 2 refills | Status: DC

## 2023-07-24 MED ORDER — CALCIUM CARBONATE ANTACID 500 MG PO CHEW
1.0000 | CHEWABLE_TABLET | Freq: Once | ORAL | Status: AC
  Administered 2023-07-24: 200 mg via ORAL
  Filled 2023-07-24: qty 1

## 2023-07-24 NOTE — Progress Notes (Signed)
SATURATION QUALIFICATIONS: (This note is used to comply with regulatory documentation for home oxygen)  Patient Saturations on Room Air at Rest = 98%  Patient Saturations on Room Air while Ambulating = 94%  Lowest drop while ambulating without oxygen was 94. So patient did well without oxygen.

## 2023-07-24 NOTE — Discharge Instructions (Signed)
Today you were seen for tingling. Thank you for letting us treat you today. After performing physical exam and reviewing your labs, I feel you are safe to go home. Please follow up with your PCP in the next several days and provide them with your records from this visit. Return to the Emergency Room if pain becomes severe or symptoms worsen.

## 2023-07-24 NOTE — Plan of Care (Signed)

## 2023-07-24 NOTE — Discharge Summary (Signed)
Physician Discharge Summary  Cristian Anderson WCB:[id number] DOB: 04/08/1976 DOA: 07/22/2023  PCP: Claiborne Rigg, NP  Admit date: 07/22/2023 Discharge date: 07/24/2023  Admitted From: Home  Discharge disposition: Home  Recommendations for Outpatient Follow-Up:   Follow up with your primary care provider in one week.  Check CBC, BMP, magnesium in the next visit   Discharge Diagnosis:   Principal Problem:   Acute respiratory failure with hypoxia (HCC) Active Problems:   OSA (obstructive sleep apnea)   Hypertensive urgency   Discharge Condition: Improved.  Diet recommendation:   Regular.  Wound care: None.  Code status: Full.   History of Present Illness:   Cristian Anderson is a 47 y.o. male with medical history significant for hypertension, OSA, morbid obesity, bipolar disorder and schizophrenia presented to hospital with cough and shortness of breath for couple of days with some post-tussive emesis.  He also complained of bilateral lower extremity edema with progressive shortness of breath.  Has not been taking his antihypertensives.  In the ED, patient was noted to be  afebrile and saturating upper 80s on room air with normal heart rate and severely elevated blood pressure.  Chest x-ray notable for cardiomegaly and vascular congestion.  Labs were within normal range.  In the ED, patient received 125 mg IV Solu-Medrol, 1 L normal saline, Zofran, and DuoNeb and was admitted hospital for further evaluation and treatment.    Hospital Course:   Following conditions were addressed during hospitalization as listed below,  Acute hypoxic respiratory failure; likely secondary to suspected COPD exacerbation  Continues to smoke.  Counseling done.  Discussed nicotine patch on discharge.  Patient received  oxygen, systemic steroids, inhaled steroids bronchodilators during hospitalization and was hypoxic on ambulation initially but subsequently did not qualify for oxygen.  HIV nonreactive.   COVID influenza and RSV was negative.  D-dimer 0.4.  EKG done in the ED showed normal sinus rhythm.  BNP within normal range, 2D echocardiogram showed preserved LV function at 60% with grade 1 diastolic dysfunction.  Patient was again still counseled against smoking.  Will be discharged home on albuterol, Dulera on discharge.   Mild leukocytosis.  Reactive secondary to steroid.    Hypertensive urgency  - BP as high as 202/112 without target organ damage.  Was noncompliant with medication for the last few days as per the patient.Marland Kitchen  Has been resumed on amlodipine, hydrochlorothiazide, Avapro.  Advised to continue on discharge.    OSA  Continue CPAP at nighttime.  Will continue   Morbid obesity. Body mass index is 55.65 kg/m.  Would benefit from weight loss as outpatient.  Lifestyle modification discussed.   History of bipolar disorder. Not on medications.   Disposition.  At this time, patient is stable for disposition home with outpatient PCP follow-up.  Medical Consultants:   None.  Procedures:    None Subjective:   Today, patient was seen and examined at bedside.  Denies any nausea, vomiting, fever, chills or rigor.  Wants to go home.  Was able to ambulate without any issues with oxygenation.  Multiple family members at bedside.  Discharge Exam:   Vitals:   07/24/23 0826 07/24/23 0900  BP: (!) 151/86   Pulse:    Resp:    Temp:    SpO2:  98%   Vitals:   07/24/23 0550 07/24/23 0742 07/24/23 0826 07/24/23 0900  BP: (!) 151/86  (!) 151/86   Pulse: 84     Resp: 18     Temp:  TempSrc:      SpO2: 94% 97%  98%  Weight:      Height:       Body mass index is 55.65 kg/m.  General: Alert awake, not in obvious distress, morbidly obese HENT: pupils equally reacting to light,  No scleral pallor or icterus noted. Oral mucosa is moist.  Chest:  Clear breath sounds.   No crackles or wheezes.  CVS: S1 &S2 heard. No murmur.  Regular rate and rhythm. Abdomen: Soft, nontender,  nondistended.  Bowel sounds are heard.   Extremities: No cyanosis, clubbing or edema.  Peripheral pulses are palpable. Psych: Alert, awake and oriented, normal mood CNS:  No cranial nerve deficits.  Power equal in all extremities.   Skin: Warm and dry.  No rashes noted.  The results of significant diagnostics from this hospitalization (including imaging, microbiology, ancillary and laboratory) are listed below for reference.     Diagnostic Studies:   ECHOCARDIOGRAM COMPLETE  Result Date: 07/23/2023    ECHOCARDIOGRAM REPORT   Patient Name:   Cristian Anderson Date of Exam: 07/23/2023 Medical Rec #:  [id number]   Height:       68.0 in Accession #:    [id number]  Weight:       366.0 lb Date of Birth:  Sep 14, 1975    BSA:          2.642 m Patient Age:    47 years    BP:           167/113 mmHg Patient Gender: M           HR:           93 bpm. Exam Location:  Inpatient Procedure: 2D Echo, Cardiac Doppler, Color Doppler and Intracardiac            Opacification Agent Indications:    Dyspnea  History:        Patient has no prior history of Echocardiogram examinations.                 Signs/Symptoms:Dyspnea; Risk Factors:Hypertension. Fatty liver.  Sonographer:    Milda Smart Referring Phys: [id number] UXLKGM Troyce Febo  Sonographer Comments: Patient is obese and suboptimal subcostal window. Image acquisition challenging due to patient body habitus. IMPRESSIONS  1. Left ventricular ejection fraction, by estimation, is 60 to 65%. The left ventricle has normal function. The left ventricle has no regional wall motion abnormalities. There is mild concentric left ventricular hypertrophy. Left ventricular diastolic parameters are consistent with Grade I diastolic dysfunction (impaired relaxation).  2. Right ventricular systolic function is normal. The right ventricular size is normal. Tricuspid regurgitation signal is inadequate for assessing PA pressure.  3. The mitral valve is grossly normal. No evidence of mitral valve  regurgitation.  4. The aortic valve was not well visualized. Aortic valve regurgitation is not visualized.  5. The inferior vena cava is normal in size with greater than 50% respiratory variability, suggesting right atrial pressure of 3 mmHg. FINDINGS  Left Ventricle: Left ventricular ejection fraction, by estimation, is 60 to 65%. The left ventricle has normal function. The left ventricle has no regional wall motion abnormalities. Definity contrast agent was given IV to delineate the left ventricular  endocardial borders. The left ventricular internal cavity size was normal in size. There is mild concentric left ventricular hypertrophy. Left ventricular diastolic parameters are consistent with Grade I diastolic dysfunction (impaired relaxation). Right Ventricle: The right ventricular size is normal. Right ventricular systolic function is normal. Tricuspid regurgitation signal is  inadequate for assessing PA pressure. Left Atrium: Left atrial size was normal in size. Right Atrium: Right atrial size was normal in size. Pericardium: There is no evidence of pericardial effusion. Mitral Valve: The mitral valve is grossly normal. No evidence of mitral valve regurgitation. Tricuspid Valve: Tricuspid valve regurgitation is not demonstrated. Aortic Valve: The aortic valve was not well visualized. Aortic valve regurgitation is not visualized. Pulmonic Valve: Pulmonic valve regurgitation is not visualized. Aorta: The aortic root and ascending aorta are structurally normal, with no evidence of dilitation. Venous: The inferior vena cava is normal in size with greater than 50% respiratory variability, suggesting right atrial pressure of 3 mmHg. IAS/Shunts: The interatrial septum was not well visualized.  LEFT VENTRICLE PLAX 2D LVIDd:         4.50 cm   Diastology LVIDs:         2.80 cm   LV e' medial:    8.27 cm/s LV PW:         1.30 cm   LV E/e' medial:  11.0 LV IVS:        1.30 cm   LV e' lateral:   6.09 cm/s LVOT diam:     2.30  cm   LV E/e' lateral: 15.0 LV SV:         138 LV SV Index:   52 LVOT Area:     4.15 cm  RIGHT VENTRICLE RV Basal diam:  4.00 cm RV Mid diam:    3.70 cm RV S prime:     17.60 cm/s TAPSE (M-mode): 2.3 cm LEFT ATRIUM             Index        RIGHT ATRIUM           Index LA diam:        4.70 cm 1.78 cm/m   RA Area:     19.50 cm LA Vol (A2C):   56.7 ml 21.46 ml/m  RA Volume:   52.00 ml  19.68 ml/m LA Vol (A4C):   58.7 ml 22.22 ml/m LA Biplane Vol: 58.6 ml 22.18 ml/m  AORTIC VALVE LVOT Vmax:   159.00 cm/s LVOT Vmean:  120.000 cm/s LVOT VTI:    0.333 m  AORTA Ao Root diam: 3.10 cm Ao Asc diam:  3.40 cm MITRAL VALVE MV Area (PHT): 2.70 cm    SHUNTS MV Decel Time: 281 msec    Systemic VTI:  0.33 m MV E velocity: 91.30 cm/s  Systemic Diam: 2.30 cm MV A velocity: 89.10 cm/s MV E/A ratio:  1.02 Halford Decamp signed by Carolan Clines Signature Date/Time: 07/23/2023/12:33:33 PM    Final    DG Chest 2 View  Result Date: 07/22/2023 CLINICAL DATA:  Shortness of breath for 3 days EXAM: CHEST - 2 VIEW COMPARISON:  Chest x-ray 10/29/2022. FINDINGS: Underinflated and under penetrated radiographs limits evaluation. Enlarged cardiopericardial silhouette with vascular congestion. Question tiny effusions. No obvious consolidation or pneumothorax. Overlapping cardiac leads. IMPRESSION: Limited x-rays.  Enlarged heart with vascular congestion. Electronically Signed   By: Karen Kays M.D.   On: 07/22/2023 18:36     Labs:   Basic Metabolic Panel: Recent Labs  Lab 07/22/23 1755 07/23/23 0538 07/24/23 0559  NA 134* 137 139  K 3.5 4.8 3.5  CL 99 100 98  CO2 26 27 31   GLUCOSE 118* 158* 122*  BUN 14 15 25*  CREATININE 1.06 1.28* 1.38*  CALCIUM 8.7* 8.8* 9.1  MG  --  2.8*  2.8*   GFR Estimated Creatinine Clearance: 99.4 mL/min (A) (by C-G formula based on SCr of 1.38 mg/dL (H)). Liver Function Tests: Recent Labs  Lab 07/22/23 1755  AST 38  ALT 50*  ALKPHOS 54  BILITOT 0.8  PROT 7.7  ALBUMIN 4.1    Recent Labs  Lab 07/22/23 1755  LIPASE 30   No results for input(s): "AMMONIA" in the last 168 hours. Coagulation profile No results for input(s): "INR", "PROTIME" in the last 168 hours.  CBC: Recent Labs  Lab 07/22/23 1755 07/23/23 0538 07/24/23 0559  WBC 9.7 11.5* 17.7*  HGB 15.5 16.7 15.6  HCT 46.1 52.4* 47.9  MCV 90.4 94.2 93.2  PLT 206 226 222   Cardiac Enzymes: No results for input(s): "CKTOTAL", "CKMB", "CKMBINDEX", "TROPONINI" in the last 168 hours. BNP: Invalid input(s): "POCBNP" CBG: No results for input(s): "GLUCAP" in the last 168 hours. D-Dimer Recent Labs    07/22/23 1755  DDIMER 0.45   Hgb A1c No results for input(s): "HGBA1C" in the last 72 hours. Lipid Profile No results for input(s): "CHOL", "HDL", "LDLCALC", "TRIG", "CHOLHDL", "LDLDIRECT" in the last 72 hours. Thyroid function studies No results for input(s): "TSH", "T4TOTAL", "T3FREE", "THYROIDAB" in the last 72 hours.  Invalid input(s): "FREET3" Anemia work up No results for input(s): "VITAMINB12", "FOLATE", "FERRITIN", "TIBC", "IRON", "RETICCTPCT" in the last 72 hours. Microbiology Recent Results (from the past 240 hour(s))  Resp panel by RT-PCR (RSV, Flu A&B, Covid) Anterior Nasal Swab     Status: None   Collection Time: 07/22/23  6:48 PM   Specimen: Anterior Nasal Swab  Result Value Ref Range Status   SARS Coronavirus 2 by RT PCR NEGATIVE NEGATIVE Final    Comment: (NOTE) SARS-CoV-2 target nucleic acids are NOT DETECTED.  The SARS-CoV-2 RNA is generally detectable in upper respiratory specimens during the acute phase of infection. The lowest concentration of SARS-CoV-2 viral copies this assay can detect is 138 copies/mL. A negative result does not preclude SARS-Cov-2 infection and should not be used as the sole basis for treatment or other patient management decisions. A negative result may occur with  improper specimen collection/handling, submission of specimen other than  nasopharyngeal swab, presence of viral mutation(s) within the areas targeted by this assay, and inadequate number of viral copies(<138 copies/mL). A negative result must be combined with clinical observations, patient history, and epidemiological information. The expected result is Negative.  Fact Sheet for Patients:  BloggerCourse.com  Fact Sheet for Healthcare Providers:  SeriousBroker.it  This test is no t yet approved or cleared by the [place] FDA and  has been authorized for detection and/or diagnosis of SARS-CoV-2 by FDA under an Emergency Use Authorization (EUA). This EUA will remain  in effect (meaning this test can be used) for the duration of the COVID-19 declaration under Section 564(b)(1) of the Act, 21 U.S.C.section 360bbb-3(b)(1), unless the authorization is terminated  or revoked sooner.       Influenza A by PCR NEGATIVE NEGATIVE Final   Influenza B by PCR NEGATIVE NEGATIVE Final    Comment: (NOTE) The Xpert Xpress SARS-CoV-2/FLU/RSV plus assay is intended as an aid in the diagnosis of influenza from Nasopharyngeal swab specimens and should not be used as a sole basis for treatment. Nasal washings and aspirates are unacceptable for Xpert Xpress SARS-CoV-2/FLU/RSV testing.  Fact Sheet for Patients: BloggerCourse.com  Fact Sheet for Healthcare Providers: SeriousBroker.it  This test is not yet approved or cleared by the [place] and has been authorized for  detection and/or diagnosis of SARS-CoV-2 by FDA under an Emergency Use Authorization (EUA). This EUA will remain in effect (meaning this test can be used) for the duration of the COVID-19 declaration under Section 564(b)(1) of the Act, 21 U.S.C. section 360bbb-3(b)(1), unless the authorization is terminated or revoked.     Resp Syncytial Virus by PCR NEGATIVE NEGATIVE Final    Comment:  (NOTE) Fact Sheet for Patients: BloggerCourse.com  Fact Sheet for Healthcare Providers: SeriousBroker.it  This test is not yet approved or cleared by the [place] FDA and has been authorized for detection and/or diagnosis of SARS-CoV-2 by FDA under an Emergency Use Authorization (EUA). This EUA will remain in effect (meaning this test can be used) for the duration of the COVID-19 declaration under Section 564(b)(1) of the Act, 21 U.S.C. section 360bbb-3(b)(1), unless the authorization is terminated or revoked.  Performed at [facility], [address], [place], [place] [address]      Discharge Instructions:   Discharge Instructions     Call MD for:  difficulty breathing, headache or visual disturbances   Complete by: As directed    Call MD for:  temperature >100.4   Complete by: As directed    Diet general   Complete by: As directed    Discharge instructions   Complete by: As directed    Follow-up with your primary care provider in 1 week.  Complete the  course of prednisone.  Do not smoke.  Uses nicotine patch as prescribed.  Seek medical attention for worsening symptoms.   Increase activity slowly   Complete by: As directed       Allergies as of 07/24/2023       Reactions   Acetaminophen Itching, Rash   Penicillins Itching, Rash        Medication List     TAKE these medications    amLODipine 10 MG tablet Commonly known as: NORVASC Take 1 tablet (10 mg total) by mouth daily.   Blood Pressure Monitor Devi Please provide patient with insurance approved blood pressure . Device broken.   carvedilol 12.5 MG tablet Commonly known as: COREG Take 1 tablet (12.5 mg total) by mouth 2 (two) times daily with a meal.   mometasone-formoterol 100-5 MCG/ACT Aero Commonly known as: DULERA Inhale 2 puffs into the lungs 2 (two) times daily.   nicotine 14 mg/24hr patch Commonly known as:  NICODERM CQ - dosed in mg/24 hours Place 1 patch (14 mg total) onto the skin daily.   predniSONE 20 MG tablet Commonly known as: DELTASONE Take 2 tablets (40 mg total) by mouth daily with breakfast for 3 days.   rosuvastatin 40 MG tablet Commonly known as: CRESTOR Take 1 tablet (40 mg total) by mouth daily.   valsartan-hydrochlorothiazide 320-25 MG tablet Commonly known as: DIOVAN-HCT Take 1 tablet by mouth daily.   Ventolin HFA 108 (90 Base) MCG/ACT inhaler Generic drug: albuterol Inhale 1-2 puffs into the lungs every 4 (four) hours as needed for wheezing or shortness of breath.        Follow-up Information     Claiborne Rigg, NP Follow up in 1 week(s).   Specialty: Nurse Practitioner Contact information: [address] [place] [address] [place] [address] [phone]                  Time coordinating discharge: 39 minutes  Signed:  Cecilia Vancleve  [facility] 07/24/2023, 10:22 AM

## 2023-07-24 NOTE — ED Provider Notes (Signed)
[place] EMERGENCY DEPARTMENT AT [facility] Provider Note   CSN: [id number] Arrival date & time: 07/24/23  2202     History  Chief Complaint  Patient presents with   chest fluttering    Cristian Anderson is a 47 y.o. male past medical history significant for schizophrenia, respiratory distress, and GERD presents today for "funny feeling coming over my body" that only lasted a couple seconds.  He describes feeling is tingling across his whole body.  Patient has no complaints at this time.  Patient denies numbness, tingling, weakness, chest pain, or shortness of breath.  HPI     Home Medications Prior to Admission medications   Medication Sig Start Date End Date Taking? Authorizing Provider  albuterol (VENTOLIN HFA) 108 (90 Base) MCG/ACT inhaler Inhale 1-2 puffs into the lungs every 4 (four) hours as needed for wheezing or shortness of breath. 04/08/23   Anders Simmonds, PA-C  amLODipine (NORVASC) 10 MG tablet Take 1 tablet (10 mg total) by mouth daily. 04/08/23   Anders Simmonds, PA-C  Blood Pressure Monitor DEVI Please provide patient with insurance approved blood pressure . Device broken. 06/22/22   Claiborne Rigg, NP  carvedilol (COREG) 12.5 MG tablet Take 1 tablet (12.5 mg total) by mouth 2 (two) times daily with a meal. 04/08/23   McClung, Marzella Schlein, PA-C  mometasone-formoterol (DULERA) 100-5 MCG/ACT AERO Inhale 2 puffs into the lungs 2 (two) times daily. 07/24/23 08/23/23  Pokhrel, Rebekah Chesterfield, MD  nicotine (NICODERM CQ - DOSED IN MG/24 HOURS) 14 mg/24hr patch Place 1 patch (14 mg total) onto the skin daily. 07/24/23 07/23/24  Pokhrel, Rebekah Chesterfield, MD  predniSONE (DELTASONE) 20 MG tablet Take 2 tablets (40 mg total) by mouth daily with breakfast for 3 days. 07/24/23 07/27/23  Pokhrel, Rebekah Chesterfield, MD  rosuvastatin (CRESTOR) 40 MG tablet Take 1 tablet (40 mg total) by mouth daily. 04/08/23   Anders Simmonds, PA-C  valsartan-hydrochlorothiazide (DIOVAN-HCT) 320-25 MG tablet Take 1  tablet by mouth daily. 04/08/23   Anders Simmonds, PA-C  carvedilol (COREG) 6.25 MG tablet Take 1 tablet (6.25 mg total) by mouth 2 (two) times daily with a meal. 07/31/22   Claiborne Rigg, NP      Allergies    Acetaminophen and Penicillins    Review of Systems   Review of Systems  All other systems reviewed and are negative.   Physical Exam Updated Vital Signs BP (!) 144/79 (BP Location: Left Arm)   Pulse 78   Temp 98 F (36.7 C) (Oral)   Resp 16   Ht 5\' 8"  (1.727 m)   Wt (!) 165.6 kg   SpO2 95%   BMI 55.50 kg/m  Physical Exam Vitals and nursing note reviewed.  Constitutional:      General: He is not in acute distress.    Appearance: He is well-developed. He is obese. He is not diaphoretic.  HENT:     Head: Normocephalic and atraumatic.     Right Ear: External ear normal.     Left Ear: External ear normal.     Nose: Nose normal.  Eyes:     Extraocular Movements: Extraocular movements intact.     Conjunctiva/sclera: Conjunctivae normal.     Pupils: Pupils are equal, round, and reactive to light.  Cardiovascular:     Rate and Rhythm: Normal rate and regular rhythm.     Heart sounds: No murmur heard. Pulmonary:     Effort: Pulmonary effort is normal. No respiratory distress.  Breath sounds: Normal breath sounds.  Abdominal:     General: Abdomen is protuberant.     Palpations: Abdomen is soft.     Tenderness: There is no abdominal tenderness.  Musculoskeletal:        General: No swelling.     Cervical back: Neck supple.  Skin:    General: Skin is warm and dry.     Capillary Refill: Capillary refill takes less than 2 seconds.  Neurological:     General: No focal deficit present.     Mental Status: He is alert and oriented to person, place, and time.  Psychiatric:        Mood and Affect: Mood normal.     ED Results / Procedures / Treatments   Labs (all labs ordered are listed, but only abnormal results are displayed) Labs Reviewed  BASIC METABOLIC  PANEL - Abnormal; Notable for the following components:      Result Value   Potassium 3.3 (*)    Glucose, Bld 163 (*)    BUN 25 (*)    Calcium 8.5 (*)    All other components within normal limits  CBC WITH DIFFERENTIAL/PLATELET - Abnormal; Notable for the following components:   WBC 14.9 (*)    Neutro Abs 10.3 (*)    Monocytes Absolute 1.4 (*)    All other components within normal limits  TROPONIN I (HIGH SENSITIVITY)  TROPONIN I (HIGH SENSITIVITY)    EKG EKG Interpretation Date/Time:  Saturday July 24 2023 22:29:24 EST Ventricular Rate:  85 PR Interval:  179 QRS Duration:  66 QT Interval:  344 QTC Calculation: 409 R Axis:   61  Text Interpretation: Sinus rhythm Nonspecific T abnormalities Confirmed by Nicanor Alcon, April ([id number]) on 07/24/2023 11:41:31 PM  Radiology ECHOCARDIOGRAM COMPLETE  Result Date: 07/23/2023    ECHOCARDIOGRAM REPORT   Patient Name:   Cristian Anderson Date of Exam: 07/23/2023 Medical Rec #:  [id number]   Height:       68.0 in Accession #:    [id number]  Weight:       366.0 lb Date of Birth:  12/01/1975    BSA:          2.642 m Patient Age:    47 years    BP:           167/113 mmHg Patient Gender: M           HR:           93 bpm. Exam Location:  Inpatient Procedure: 2D Echo, Cardiac Doppler, Color Doppler and Intracardiac            Opacification Agent Indications:    Dyspnea  History:        Patient has no prior history of Echocardiogram examinations.                 Signs/Symptoms:Dyspnea; Risk Factors:Hypertension. Fatty liver.  Sonographer:    Milda Smart Referring Phys: [id number] TKZSWF POKHREL  Sonographer Comments: Patient is obese and suboptimal subcostal window. Image acquisition challenging due to patient body habitus. IMPRESSIONS  1. Left ventricular ejection fraction, by estimation, is 60 to 65%. The left ventricle has normal function. The left ventricle has no regional wall motion abnormalities. There is mild concentric left ventricular hypertrophy. Left  ventricular diastolic parameters are consistent with Grade I diastolic dysfunction (impaired relaxation).  2. Right ventricular systolic function is normal. The right ventricular size is normal. Tricuspid regurgitation signal is inadequate for assessing PA pressure.  3. The  mitral valve is grossly normal. No evidence of mitral valve regurgitation.  4. The aortic valve was not well visualized. Aortic valve regurgitation is not visualized.  5. The inferior vena cava is normal in size with greater than 50% respiratory variability, suggesting right atrial pressure of 3 mmHg. FINDINGS  Left Ventricle: Left ventricular ejection fraction, by estimation, is 60 to 65%. The left ventricle has normal function. The left ventricle has no regional wall motion abnormalities. Definity contrast agent was given IV to delineate the left ventricular  endocardial borders. The left ventricular internal cavity size was normal in size. There is mild concentric left ventricular hypertrophy. Left ventricular diastolic parameters are consistent with Grade I diastolic dysfunction (impaired relaxation). Right Ventricle: The right ventricular size is normal. Right ventricular systolic function is normal. Tricuspid regurgitation signal is inadequate for assessing PA pressure. Left Atrium: Left atrial size was normal in size. Right Atrium: Right atrial size was normal in size. Pericardium: There is no evidence of pericardial effusion. Mitral Valve: The mitral valve is grossly normal. No evidence of mitral valve regurgitation. Tricuspid Valve: Tricuspid valve regurgitation is not demonstrated. Aortic Valve: The aortic valve was not well visualized. Aortic valve regurgitation is not visualized. Pulmonic Valve: Pulmonic valve regurgitation is not visualized. Aorta: The aortic root and ascending aorta are structurally normal, with no evidence of dilitation. Venous: The inferior vena cava is normal in size with greater than 50% respiratory variability,  suggesting right atrial pressure of 3 mmHg. IAS/Shunts: The interatrial septum was not well visualized.  LEFT VENTRICLE PLAX 2D LVIDd:         4.50 cm   Diastology LVIDs:         2.80 cm   LV e' medial:    8.27 cm/s LV PW:         1.30 cm   LV E/e' medial:  11.0 LV IVS:        1.30 cm   LV e' lateral:   6.09 cm/s LVOT diam:     2.30 cm   LV E/e' lateral: 15.0 LV SV:         138 LV SV Index:   52 LVOT Area:     4.15 cm  RIGHT VENTRICLE RV Basal diam:  4.00 cm RV Mid diam:    3.70 cm RV S prime:     17.60 cm/s TAPSE (M-mode): 2.3 cm LEFT ATRIUM             Index        RIGHT ATRIUM           Index LA diam:        4.70 cm 1.78 cm/m   RA Area:     19.50 cm LA Vol (A2C):   56.7 ml 21.46 ml/m  RA Volume:   52.00 ml  19.68 ml/m LA Vol (A4C):   58.7 ml 22.22 ml/m LA Biplane Vol: 58.6 ml 22.18 ml/m  AORTIC VALVE LVOT Vmax:   159.00 cm/s LVOT Vmean:  120.000 cm/s LVOT VTI:    0.333 m  AORTA Ao Root diam: 3.10 cm Ao Asc diam:  3.40 cm MITRAL VALVE MV Area (PHT): 2.70 cm    SHUNTS MV Decel Time: 281 msec    Systemic VTI:  0.33 m MV E velocity: 91.30 cm/s  Systemic Diam: 2.30 cm MV A velocity: 89.10 cm/s MV E/A ratio:  1.02 Photographer signed by Carolan Clines Signature Date/Time: 07/23/2023/12:33:33 PM    Final  Procedures Procedures    Medications Ordered in ED Medications  calcium carbonate (TUMS - dosed in mg elemental calcium) chewable tablet 200 mg of elemental calcium (200 mg of elemental calcium Oral Given 07/24/23 2355)  potassium chloride (KLOR-CON M) CR tablet 10 mEq (10 mEq Oral Given 07/24/23 2355)    ED Course/ Medical Decision Making/ A&P                                 Medical Decision Making Amount and/or Complexity of Data Reviewed Labs: ordered.   This patient presents to the ED with chief complaint(s) of "funny feeling" with pertinent past medical history of GERD, schizophrenia, respiratory distress which further complicates the presenting complaint. The complaint  involves an extensive differential diagnosis and also carries with it a high risk of complications and morbidity.    The differential diagnosis includes STEMI, NSTEMI, tingling sensation  Additional history obtained: Records reviewed previous admission documents  ED Course and Reassessment: Patient given p.o. potassium and calcium.  Independent labs interpretation:  The following labs were independently interpreted:  CBC: Leukocytosis at 14.9 which is improved from earlier today of 17.7 Troponin: 8 BMP: Mild hypokalemia, mildly elevated BUN 25, mildly decreased calcium EKG: Sinus rhythm, nonspecific T wave abnormality in the lateral leads, minimal ST elevation inferior leads   Consultation: - Consulted or discussed management/test interpretation w/ external professional: None  Consideration for admission or further workup: Sober admission or further workup however patient's vital signs, physical exam, and labs have all been reassuring.  Patient currently has no symptoms.  Patient feels comfortable with discharge and follow-up with PCP as needed.        Final Clinical Impression(s) / ED Diagnoses Final diagnoses:  Tingling    Rx / DC Orders ED Discharge Orders     None         Dolphus Jenny, PA-C 07/24/23 2359    Melene Plan, DO 07/25/23 1735

## 2023-07-24 NOTE — ED Triage Notes (Signed)
Per EMS  "Fluttering in chest" Started at 9pm

## 2023-07-26 ENCOUNTER — Telehealth: Payer: Self-pay

## 2023-07-26 ENCOUNTER — Telehealth: Payer: Self-pay | Admitting: Nurse Practitioner

## 2023-07-26 NOTE — Telephone Encounter (Signed)
Pt is calling in returning a call from Junior. Please follow up with pt.

## 2023-07-26 NOTE — Transitions of Care (Post Inpatient/ED Visit) (Signed)
   07/26/2023  Name: Cristian Anderson MRN: [id number] DOB: [birth year]  Today's TOC FU Call Status: Today's TOC FU Call Status:: Unsuccessful Call (1st Attempt) Unsuccessful Call (1st Attempt) Date: 07/26/23  Attempted to reach the patient regarding the most recent Inpatient/ED visit.  Follow Up Plan: Additional outreach attempts will be made to reach the patient to complete the Transitions of Care (Post Inpatient/ED visit) call.   Signature  Robyne Peers, RN

## 2023-07-27 ENCOUNTER — Telehealth: Payer: BLUE CROSS/BLUE SHIELD | Admitting: Nurse Practitioner

## 2023-07-27 ENCOUNTER — Telehealth: Payer: Self-pay

## 2023-07-27 ENCOUNTER — Encounter: Payer: Self-pay | Admitting: Nurse Practitioner

## 2023-07-27 DIAGNOSIS — Z09 Encounter for follow-up examination after completed treatment for conditions other than malignant neoplasm: Secondary | ICD-10-CM

## 2023-07-27 DIAGNOSIS — I10 Essential (primary) hypertension: Secondary | ICD-10-CM

## 2023-07-27 NOTE — Progress Notes (Signed)
Virtual Visit Consent   Cristian Anderson, you are scheduled for a virtual visit with a [facility] provider today. Just as with appointments in the office, your consent must be obtained to participate. Your consent will be active for this visit and any virtual visit you may have with one of our providers in the next 365 days. If you have a MyChart account, a copy of this consent can be sent to you electronically.  As this is a virtual visit, video technology does not allow for your provider to perform a traditional examination. This may limit your provider's ability to fully assess your condition. If your provider identifies any concerns that need to be evaluated in person or the need to arrange testing (such as labs, EKG, etc.), we will make arrangements to do so. Although advances in technology are sophisticated, we cannot ensure that it will always work on either your end or our end. If the connection with a video visit is poor, the visit may have to be switched to a telephone visit. With either a video or telephone visit, we are not always able to ensure that we have a secure connection.  By engaging in this virtual visit, you consent to the provision of healthcare and authorize for your insurance to be billed (if applicable) for the services provided during this visit. Depending on your insurance coverage, you may receive a charge related to this service.  I need to obtain your verbal consent now. Are you willing to proceed with your visit today? Cristian Anderson has provided verbal consent on 07/27/2023 for a virtual visit (video or telephone). Claiborne Rigg, NP  Date: 07/27/2023 1:54 PM  Virtual Visit via Video Note   I, Claiborne Rigg, connected with  Cristian Anderson  ([id number], 06/11/1976) on 07/27/23 at  1:30 PM EST by a video-enabled telemedicine application and verified that I am speaking with the correct person using two identifiers.  Location: Patient: Virtual Visit Location Patient:  Home Provider: Virtual Visit Location Provider: Home Office   I discussed the limitations of evaluation and management by telemedicine and the availability of in person appointments. The patient expressed understanding and agreed to proceed.    History of Present Illness: Cristian Anderson is a 47 y.o. who identifies as a male who was assigned male at birth, and is being seen today for BLOOD PRESSURE CHECK.  He has been using his mother's blood pressure monitoring device at home and unfortunately does not have any readings for my review today.  States the blood pressure monitor is not available right now and his mother is not at home to give him the monitor.  He can recall 1 blood pressure reading around 170/90 but reports most readings are near normal.  He is currently prescribed amlodipine 10 mg daily carvedilol 12.5 mg twice daily and Diovan HCT 320-25 mg daily.  He is taking all medications as instructed. He was recommended at his last visit with me on November 19 but he will check his blood pressure readings for 2 weeks and follow-up with me for virtual visit.  He was admitted to the hospital on July 22, 2023 and discharged home on July 24, 2023 after being treated for COPD exacerbation and hypertensive urgency.  Notes from that hospital admission states he endorses noncompliance with taking medications prior to his admission.BNP within normal range, 2D echocardiogram showed preserved LV function at 60% with grade 1 diastolic dysfunction.  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   BMI 50.0-59.9, adult (HCC) 05/04/2018   Abnormal results of liver function studies Jan 12, 2018   Prediabetes 2018-01-12   Essential hypertension 11/27/2017   Mixed hyperlipidemia 11/27/2017   Nicotine dependence, other tobacco product, uncomplicated 11/27/2017   OSA (obstructive sleep apnea) 11/27/2017   PTSD (post-traumatic stress disorder) 11/04/2014    Allergies:  Allergies  Allergen Reactions   Acetaminophen Itching and Rash   Penicillins Itching and Rash   Medications:  Current Outpatient Medications:    albuterol (VENTOLIN HFA) 108 (90 Base) MCG/ACT inhaler, Inhale 1-2 puffs into the lungs every 4 (four) hours as needed for wheezing or shortness of breath., Disp: 18 each, Rfl: 2   amLODipine (NORVASC) 10 MG tablet, Take 1 tablet (10 mg total) by mouth daily., Disp: 90 tablet, Rfl: 1   Blood Pressure Monitor DEVI, Please provide patient with insurance approved blood pressure . Device broken., Disp: 1 each, Rfl: 0   carvedilol (COREG) 12.5 MG tablet, Take 1 tablet (12.5 mg total) by mouth 2 (two) times daily with a meal., Disp: 180 tablet, Rfl: 1   mometasone-formoterol (DULERA) 100-5 MCG/ACT AERO, Inhale 2 puffs into the lungs 2 (two) times daily., Disp: 1 each, Rfl: 2   nicotine (NICODERM CQ - DOSED IN MG/24 HOURS) 14 mg/24hr patch, Place 1 patch (14 mg total) onto the skin daily., Disp: 30 patch, Rfl: 0   predniSONE (DELTASONE) 20 MG tablet, Take 2 tablets (40 mg total) by mouth daily with breakfast for 3 days., Disp: 6 tablet, Rfl: 0   rosuvastatin (CRESTOR) 40 MG tablet, Take 1 tablet (40 mg total) by mouth daily., Disp: 90 tablet, Rfl: 3   valsartan-hydrochlorothiazide (DIOVAN-HCT) 320-25 MG tablet, Take 1 tablet by mouth daily., Disp: 90 tablet, Rfl: 1  Observations/Objective: Patient is well-developed,  well-nourished in no acute distress.  Resting comfortably  at home.  Head is normocephalic, atraumatic.  No labored breathing.  Speech is clear and coherent with logical content.  Patient is alert and oriented at baseline.    Assessment and Plan: 1. Primary hypertension Continue all antihypertensives as prescribed.  Reminded to bring in blood pressure log for follow  up appointment.  RECOMMENDATIONS: DASH/Mediterranean Diets are healthier choices for HTN.    2. Hospital discharge follow-up   Follow Up Instructions: I discussed the assessment and treatment plan with the patient. The patient was provided an opportunity to ask questions and all were answered. The patient agreed with the plan and demonstrated an understanding of the instructions.  A copy of instructions were sent to the patient via MyChart unless otherwise noted below.     The patient was advised to call back or seek an in-person evaluation if the symptoms worsen or if the condition fails to improve as anticipated.    Claiborne Rigg, NP

## 2023-07-27 NOTE — Telephone Encounter (Signed)
Call returned to patient and documented in [facility] call today

## 2023-07-27 NOTE — Transitions of Care (Post Inpatient/ED Visit) (Signed)
   07/27/2023  Name: Cristian Anderson MRN: [id number] DOB: September 24, 1975  Today's TOC FU Call Status: Today's TOC FU Call Status:: Successful TOC FU Call Completed Unsuccessful Call (1st Attempt) Date: 07/26/23 [employer] FU Call Complete Date: 07/27/23 Patient's Name and Date of Birth confirmed.  Transition Care Management Follow-up Telephone Call Date of Discharge: 07/24/23 Discharge Facility: [facility]) Type of Discharge: Inpatient Admission Primary Inpatient Discharge Diagnosis:: acute respiratory failure with hypoxia How have you been since you were released from the hospital?: Better Any questions or concerns?: No  Items Reviewed: Did you receive and understand the discharge instructions provided?: Yes Medications obtained,verified, and reconciled?: Partial Review Completed Reason for Partial Mediation Review: He said that he has all medications and did not have any questions about the meds and did not need to review the med regime, Any new allergies since your discharge?: No Dietary orders reviewed?: Yes Type of Diet Ordered:: heart healthy, low sodium Do you have support at home?: Yes People in Home: parent(s) Name of Support/Comfort Primary Source: his mother  Medications Reviewed Today: Medications Reviewed Today   Medications were not reviewed in this encounter     Home Care and Equipment/Supplies: Were Home Health Services Ordered?: No Any new equipment or medical supplies ordered?: No  Functional Questionnaire: Do you need assistance with bathing/showering or dressing?: No Do you need assistance with meal preparation?: No Do you need assistance with eating?: No Do you have difficulty maintaining continence: No Do you need assistance with getting out of bed/getting out of a chair/moving?: No Do you have difficulty managing or taking your medications?: No  Follow up appointments reviewed: PCP Follow-up appointment confirmed?: Yes Date of PCP follow-up appointment?:  07/27/23 Follow-up Provider: Bertram Denver, NP - virtual visit today Specialist Hospital Follow-up appointment confirmed?: NA Do you need transportation to your follow-up appointment?: No Do you understand care options if your condition(s) worsen?: Yes-patient verbalized understanding    SIGNATURE Robyne Peers, RN

## 2023-09-08 ENCOUNTER — Other Ambulatory Visit: Payer: Self-pay | Admitting: Nurse Practitioner

## 2023-09-08 ENCOUNTER — Ambulatory Visit: Payer: MEDICAID | Attending: Nurse Practitioner | Admitting: Nurse Practitioner

## 2023-09-08 ENCOUNTER — Encounter: Payer: Self-pay | Admitting: Nurse Practitioner

## 2023-09-08 VITALS — BP 121/77 | HR 72 | Resp 20 | Ht 68.0 in | Wt 361.8 lb

## 2023-09-08 DIAGNOSIS — J9801 Acute bronchospasm: Secondary | ICD-10-CM | POA: Diagnosis not present

## 2023-09-08 DIAGNOSIS — Z0000 Encounter for general adult medical examination without abnormal findings: Secondary | ICD-10-CM

## 2023-09-08 MED ORDER — MOMETASONE FURO-FORMOTEROL FUM 100-5 MCG/ACT IN AERO: 2.0000 | INHALATION_SPRAY | Freq: Two times a day (BID) | RESPIRATORY_TRACT | 6 refills | Status: DC

## 2023-09-08 NOTE — Telephone Encounter (Signed)
 Requested medication (s) are due for refill today: alternative requested  Requested medication (s) are on the active medication list: yes  Last refill:  09/08/23  Future visit scheduled: yes  Notes to clinic:    Pharmacy comment: Alternative Requested:THE PRESCRIBED MEDICATION IS NOT COVERED BY INSURANCE. PLEASE CONSIDER CHANGING TO ONE OF THE SUGGESTED COVERED ALTERNATIVES.       Requested Prescriptions  Pending Prescriptions Disp Refills   SYMBICORT 160-4.5 MCG/ACT inhaler [Pharmacy Med Name: SYMBICORT 160-4.5 MCG INHALER] 1 each 0     Pulmonology:  Combination Products Passed - 09/08/2023  2:25 PM      Passed - Valid encounter within last 12 months    Recent Outpatient Visits           Today Annual physical exam   [facility] - A Dept Of [place]. [facility] Collins Dean, NP   1 month ago Primary hypertension   [facility] - A Dept Of [place]. [facility] Collins Dean, NP   1 month ago Essential hypertension   [facility] - A Dept Of [place]. [facility] Collins Dean, NP   5 months ago Essential hypertension   [facility] - A Dept Of [place]. [facility], Shelvy Dickens M, [place]   11 months ago Essential hypertension   [facility] - A Dept Of . [facility] Valente Gaskin, RPH-CPP       Future Appointments             In 3 months Collins Dean, NP [facility] - A Dept Of . [facility]

## 2023-09-08 NOTE — Progress Notes (Signed)
 Assessment & Plan:  Cristian "Rome" was seen today for annual exam.  Diagnoses and all orders for this visit:  Annual physical exam -     CBC with Differential -     CMP14+EGFR    Patient has been counseled on age-appropriate routine health concerns for screening and prevention. These are reviewed and up-to-date. Referrals have been placed accordingly. Immunizations are up-to-date or declined.    Subjective:   Chief Complaint  Patient presents with   Annual Exam    Cristian Anderson 48 y.o. male presents to office today for annual physical. He is doing well today with on complaints. Tolerating CPAP and using nightly as instructed.  Blood pressure is well controlled.   He has a past medical history of Allergy, Anxiety attack, Bipolar 1 disorder (HCC), Depression, Fatty liver, GERD, Hypertension, Obesity, Oxygen deficiency, Panic attack, PTSD, Schizophrenia, and Sleep apnea.    BP Readings from Last 3 Encounters:  09/08/23 121/77  07/24/23 (!) 144/79  07/24/23 (!) 151/86     Review of Systems  Constitutional:  Negative for fever, malaise/fatigue and weight loss.  HENT: Negative.  Negative for nosebleeds.   Eyes: Negative.  Negative for blurred vision, double vision and photophobia.  Respiratory: Negative.  Negative for cough and shortness of breath.   Cardiovascular: Negative.  Negative for chest pain, palpitations and leg swelling.  Gastrointestinal: Negative.  Negative for heartburn, nausea and vomiting.  Genitourinary: Negative.   Musculoskeletal: Negative.  Negative for myalgias.  Skin: Negative.   Neurological: Negative.  Negative for dizziness, focal weakness, seizures and headaches.  Endo/Heme/Allergies: Negative.   Psychiatric/Behavioral: Negative.  Negative for suicidal ideas.     Past Medical History:  Diagnosis Date   Allergy    Anxiety attack    Bipolar 1 disorder (HCC)    Depression    Fatty liver    GERD (gastroesophageal reflux disease)    Hypertension     Obesity    Oxygen deficiency    C-pap machine   Panic attack    PTSD (post-traumatic stress disorder)    Schizophrenia (HCC)    Sleep apnea     History reviewed. No pertinent surgical history.  Family History  Problem Relation Age of Onset   Diabetes Father    Colon polyps Brother    Anxiety disorder Brother    Depression Brother    Heart disease Maternal Aunt     Social History Reviewed with no changes to be made today.   Outpatient Medications Prior to Visit  Medication Sig Dispense Refill   albuterol  (VENTOLIN  HFA) 108 (90 Base) MCG/ACT inhaler Inhale 1-2 puffs into the lungs every 4 (four) hours as needed for wheezing or shortness of breath. 18 each 2   amLODipine  (NORVASC ) 10 MG tablet Take 1 tablet (10 mg total) by mouth daily. 90 tablet 1   Blood Pressure Monitor DEVI Please provide patient with insurance approved blood pressure . Device broken. 1 each 0   carvedilol  (COREG ) 12.5 MG tablet Take 1 tablet (12.5 mg total) by mouth 2 (two) times daily with a meal. 180 tablet 1   rosuvastatin  (CRESTOR ) 40 MG tablet Take 1 tablet (40 mg total) by mouth daily. 90 tablet 3   valsartan -hydrochlorothiazide  (DIOVAN -HCT) 320-25 MG tablet Take 1 tablet by mouth daily. 90 tablet 1   mometasone -formoterol  (DULERA ) 100-5 MCG/ACT AERO Inhale 2 puffs into the lungs 2 (two) times daily. 1 each 2   nicotine  (NICODERM CQ  - DOSED IN MG/24 HOURS) 14 mg/24hr patch  Place 1 patch (14 mg total) onto the skin daily. 30 patch 0   No facility-administered medications prior to visit.    Allergies  Allergen Reactions   Acetaminophen  Itching and Rash   Penicillins Itching and Rash       Objective:    BP 121/77 (BP Location: Left Arm, Patient Position: Sitting, Cuff Size: Large)   Pulse 72   Resp 20   Ht 5\' 8"  (1.727 m)   Wt (!) 361 lb 12.8 oz (164.1 kg)   SpO2 100%   BMI 55.01 kg/m  Wt Readings from Last 3 Encounters:  09/08/23 (!) 361 lb 12.8 oz (164.1 kg)  07/24/23 (!) 365 lb (165.6  kg)  07/24/23 (!) 365 lb 15.8 oz (166 kg)    Physical Exam Constitutional:      Appearance: He is well-developed.  HENT:     Head: Normocephalic and atraumatic.     Right Ear: Hearing, tympanic membrane, ear canal and external ear normal.     Left Ear: Hearing, tympanic membrane, ear canal and external ear normal.     Nose: Nose normal. No mucosal edema or rhinorrhea.     Right Turbinates: Not enlarged.     Left Turbinates: Not enlarged.     Mouth/Throat:     Lips: Pink.     Mouth: Mucous membranes are moist.     Dentition: No gingival swelling, dental abscesses or gum lesions.     Pharynx: Uvula midline.     Tonsils: No tonsillar exudate. 1+ on the right. 1+ on the left.  Eyes:     General: Lids are normal. No scleral icterus.    Extraocular Movements: Extraocular movements intact.     Conjunctiva/sclera: Conjunctivae normal.     Pupils: Pupils are equal, round, and reactive to light.  Neck:     Thyroid: No thyromegaly.     Trachea: No tracheal deviation.  Cardiovascular:     Rate and Rhythm: Normal rate and regular rhythm.     Heart sounds: Normal heart sounds. No murmur heard.    No friction rub. No gallop.  Pulmonary:     Effort: Pulmonary effort is normal. No respiratory distress.     Breath sounds: Normal breath sounds. No wheezing or rales.  Chest:     Chest wall: No mass or tenderness.  Breasts:    Right: No inverted nipple, mass, nipple discharge, skin change or tenderness.     Left: No inverted nipple, mass, nipple discharge, skin change or tenderness.  Abdominal:     General: Bowel sounds are normal. There is no distension.     Palpations: Abdomen is soft. There is no mass.     Tenderness: There is no abdominal tenderness. There is no guarding or rebound.  Musculoskeletal:        General: No tenderness or deformity. Normal range of motion.     Cervical back: Normal range of motion and neck supple.  Lymphadenopathy:     Cervical: No cervical adenopathy.   Skin:    General: Skin is warm and dry.     Capillary Refill: Capillary refill takes less than 2 seconds.     Findings: No erythema.  Neurological:     Mental Status: He is alert and oriented to person, place, and time.     Cranial Nerves: No cranial nerve deficit.     Sensory: Sensation is intact.     Motor: No abnormal muscle tone.     Coordination: Coordination is intact. Coordination  normal.     Gait: Gait is intact.     Deep Tendon Reflexes: Reflexes normal.     Reflex Scores:      Patellar reflexes are 1+ on the right side and 1+ on the left side. Psychiatric:        Attention and Perception: Attention normal.        Mood and Affect: Mood normal.        Speech: Speech normal.        Behavior: Behavior normal.        Thought Content: Thought content normal.        Judgment: Judgment normal.          Patient has been counseled extensively about nutrition and exercise as well as the importance of adherence with medications and regular follow-up. The patient was given clear instructions to go to ER or return to medical center if symptoms don't improve, worsen or new problems develop. The patient verbalized understanding.   Follow-up: Return in about 3 months (around 12/07/2023).   Collins Dean, FNP-BC [facility] [place], [place] [phone]   09/08/2023, 5:19 PM

## 2023-09-08 NOTE — Patient Instructions (Signed)
 [facility] [address] [place], [place] [address] ?PH# [phone] ?

## 2023-09-09 LAB — CMP14+EGFR
ALT: 35 IU/L (ref 0–44)
AST: 22 IU/L (ref 0–40)
Albumin: 4.3 g/dL (ref 4.1–5.1)
Alkaline Phosphatase: 70 IU/L (ref 44–121)
BUN/Creatinine Ratio: 9 (ref 9–20)
BUN: 9 mg/dL (ref 6–24)
Bilirubin Total: 0.3 mg/dL (ref 0.0–1.2)
CO2: 26 mmol/L (ref 20–29)
Calcium: 9 mg/dL (ref 8.7–10.2)
Chloride: 99 mmol/L (ref 96–106)
Creatinine, Ser: 1.02 mg/dL (ref 0.76–1.27)
Globulin, Total: 2.5 g/dL (ref 1.5–4.5)
Glucose: 104 mg/dL — ABNORMAL HIGH (ref 70–99)
Potassium: 3.9 mmol/L (ref 3.5–5.2)
Sodium: 141 mmol/L (ref 134–144)
Total Protein: 6.8 g/dL (ref 6.0–8.5)
eGFR: 91 mL/min/1.73 (ref >59)

## 2023-09-09 LAB — CBC WITH DIFFERENTIAL/PLATELET
Basophils Absolute: 0 10*3/uL (ref 0.0–0.2)
Basos: 1 %
EOS (ABSOLUTE): 0.5 10*3/uL — ABNORMAL HIGH (ref 0.0–0.4)
Eos: 8 %
Hematocrit: 37.2 % — ABNORMAL LOW (ref 37.5–51.0)
Hemoglobin: 11.6 g/dL — ABNORMAL LOW (ref 13.0–17.7)
Immature Grans (Abs): 0 10*3/uL (ref 0.0–0.1)
Immature Granulocytes: 1 %
Lymphocytes Absolute: 1.7 10*3/uL (ref 0.7–3.1)
Lymphs: 27 %
MCH: 28.4 pg (ref 26.6–33.0)
MCHC: 31.2 g/dL — ABNORMAL LOW (ref 31.5–35.7)
MCV: 91 fL (ref 79–97)
Monocytes Absolute: 0.7 10*3/uL (ref 0.1–0.9)
Monocytes: 11 %
Neutrophils Absolute: 3.4 10*3/uL (ref 1.4–7.0)
Neutrophils: 52 %
Platelets: 230 10*3/uL (ref 150–450)
RBC: 4.09 x10E6/uL — ABNORMAL LOW (ref 4.14–5.80)
RDW: 13.4 % (ref 11.6–15.4)
WBC: 6.5 10*3/uL (ref 3.4–10.8)

## 2023-09-20 ENCOUNTER — Encounter: Payer: Self-pay | Admitting: Nurse Practitioner

## 2023-12-08 ENCOUNTER — Ambulatory Visit: Payer: Self-pay | Admitting: Nurse Practitioner

## 2023-12-14 ENCOUNTER — Ambulatory Visit: Payer: MEDICAID | Attending: Nurse Practitioner | Admitting: Nurse Practitioner

## 2023-12-14 ENCOUNTER — Encounter: Payer: Self-pay | Admitting: Nurse Practitioner

## 2023-12-14 VITALS — BP 126/82 | HR 76 | Resp 19 | Ht 68.0 in | Wt 365.0 lb

## 2023-12-14 DIAGNOSIS — M25511 Pain in right shoulder: Secondary | ICD-10-CM

## 2023-12-14 DIAGNOSIS — R7303 Prediabetes: Secondary | ICD-10-CM

## 2023-12-14 DIAGNOSIS — E782 Mixed hyperlipidemia: Secondary | ICD-10-CM | POA: Diagnosis not present

## 2023-12-14 DIAGNOSIS — I10 Essential (primary) hypertension: Secondary | ICD-10-CM

## 2023-12-14 DIAGNOSIS — G8929 Other chronic pain: Secondary | ICD-10-CM

## 2023-12-14 DIAGNOSIS — D649 Anemia, unspecified: Secondary | ICD-10-CM

## 2023-12-14 MED ORDER — MELOXICAM 15 MG PO TABS: 15.0000 mg | ORAL_TABLET | Freq: Every day | ORAL | 0 refills | Status: DC

## 2023-12-14 NOTE — Progress Notes (Signed)
 Assessment & Plan:  Cristian "Rome" was seen today for shoulder pain.  Diagnoses and all orders for this visit:  Chronic Right Shoulder Pain Referral for X-ray of right shoulder at [facility] Meloxicam  15 mg every day as needed for pain  Essential hypertension Amlodipine  10 mg every day Carvedilol  12.5 mg twice per day Valsartan /hydrochlorothiazide  320/25 mg every day Take medications as prescribed Check blood pressure at home.  Exercise 3-5 times per week such as walking for at least 30 minutes per day Recommended DASH/Mediterranean diet best choice for HTN and weight loss  Mixed hyperlipidemia -     Lipid Panel; Future Continue rosuvastatin  40 mg every day   Prediabetes -     Hemoglobin A1c; Future  Anemia, unspecified type -     CBC with Differential; Future    Patient has been counseled on age-appropriate routine health concerns for screening and prevention. These are reviewed and up-to-date. Referrals have been placed accordingly. Immunizations are up-to-date or declined.    Subjective:   Chief Complaint  Patient presents with   Shoulder Pain    Soreness and sharp pain in right shoulder.    Cristian Anderson 48 y.o. male presents to office today right should pain. Rated pain 5/10 on pain scale and aggravated when performing activities such as lifting. The pain started 2 months ago and progressively worsened in past 5 days. Taking over the counter Advil  and Ibuprofen  as needed with minimal relief.  Referral sent for xray of right shoulder. Prescribed Meloxicam  15 mg every day as needed for pain.   HTN Blood pressure is well controlled. He is currently prescribed amlodipine  10 mg daily, carvedilol  12.5 mg BID and diovan  hct 320-25 mg daily.  BP Readings from Last 3 Encounters:  12/14/23 126/82  09/08/23 121/77  07/24/23 (!) 144/79    Review of Systems  Constitutional: Negative.   HENT: Negative.    Eyes: Negative.   Respiratory: Negative.    Cardiovascular:  Negative.   Gastrointestinal: Negative.   Genitourinary: Negative.   Musculoskeletal:  Positive for joint pain.  Skin: Negative.   Neurological: Negative.   Endo/Heme/Allergies: Negative.   Psychiatric/Behavioral: Negative.      Past Medical History:  Diagnosis Date   Allergy    Anxiety attack    Bipolar 1 disorder (HCC)    Depression    Fatty liver    GERD (gastroesophageal reflux disease)    Hypertension    Obesity    Oxygen deficiency    C-pap machine   Panic attack    PTSD (post-traumatic stress disorder)    Schizophrenia (HCC)    Sleep apnea     No past surgical history on file.  Family History  Problem Relation Age of Onset   Diabetes Father    Colon polyps Brother    Anxiety disorder Brother    Depression Brother    Heart disease Maternal Aunt     Social History Reviewed with no changes to be made today.   Outpatient Medications Prior to Visit  Medication Sig Dispense Refill   budesonide-formoterol  (SYMBICORT) 160-4.5 MCG/ACT inhaler Inhale 2 puffs into the lungs in the morning and at bedtime. 1 each 3   rosuvastatin  (CRESTOR ) 40 MG tablet Take 1 tablet (40 mg total) by mouth daily. 90 tablet 3   amLODipine  (NORVASC ) 10 MG tablet Take 1 tablet (10 mg total) by mouth daily. 90 tablet 1   carvedilol  (COREG ) 12.5 MG tablet Take 1 tablet (12.5 mg total) by mouth 2 (  two) times daily with a meal. 180 tablet 1   valsartan -hydrochlorothiazide  (DIOVAN -HCT) 320-25 MG tablet Take 1 tablet by mouth daily. 90 tablet 1   albuterol  (VENTOLIN  HFA) 108 (90 Base) MCG/ACT inhaler Inhale 1-2 puffs into the lungs every 4 (four) hours as needed for wheezing or shortness of breath. (Patient not taking: Reported on 12/14/2023) 18 each 2   Blood Pressure Monitor DEVI Please provide patient with insurance approved blood pressure . Device broken. 1 each 0   No facility-administered medications prior to visit.    Allergies  Allergen Reactions   Acetaminophen  Itching and Rash    Penicillins Itching and Rash       Objective:    BP 126/82 (Cuff Size: Large)   Pulse 76   Resp 19   Ht 5\' 8"  (1.727 m)   Wt (!) 365 lb (165.6 kg)   SpO2 100%   BMI 55.50 kg/m  Wt Readings from Last 3 Encounters:  12/14/23 (!) 365 lb (165.6 kg)  09/08/23 (!) 361 lb 12.8 oz (164.1 kg)  07/24/23 (!) 365 lb (165.6 kg)   BP Readings from Last 3 Encounters:  12/14/23 126/82  09/08/23 121/77  07/24/23 (!) 144/79     Physical Exam Constitutional:      Appearance: Normal appearance.  HENT:     Head: Normocephalic.  Cardiovascular:     Rate and Rhythm: Normal rate and regular rhythm.     Pulses: Normal pulses.     Heart sounds: Normal heart sounds.  Pulmonary:     Breath sounds: Normal breath sounds.  Musculoskeletal:        General: Tenderness present.     Right shoulder: Tenderness present.     Left shoulder: Normal.       Arms:     Cervical back: Normal range of motion.  Skin:    General: Skin is warm and dry.  Neurological:     Mental Status: He is alert and oriented to person, place, and time. Mental status is at baseline.  Psychiatric:        Mood and Affect: Mood normal.        Behavior: Behavior normal. Behavior is cooperative.        Thought Content: Thought content normal.        Judgment: Judgment normal.         Patient has been counseled extensively about nutrition and exercise as well as the importance of adherence with medications and regular follow-up. The patient was given clear instructions to go to ER or return to medical center if symptoms don't improve, worsen or new problems develop. The patient verbalized understanding.   Follow-up: Return in about 3 months (around 03/14/2024).   [name], BSN RN -student FNP [facility] [place], [place] [phone]   12/15/2023, 4:52 PM

## 2023-12-15 ENCOUNTER — Encounter: Payer: Self-pay | Admitting: Nurse Practitioner

## 2023-12-15 MED ORDER — VALSARTAN-HYDROCHLOROTHIAZIDE 320-25 MG PO TABS: 1.0000 | ORAL_TABLET | Freq: Every day | ORAL | 1 refills | Status: DC

## 2023-12-15 MED ORDER — AMLODIPINE BESYLATE 10 MG PO TABS: 10.0000 mg | ORAL_TABLET | Freq: Every day | ORAL | 1 refills | Status: DC

## 2023-12-15 MED ORDER — CARVEDILOL 12.5 MG PO TABS: 12.5000 mg | ORAL_TABLET | Freq: Two times a day (BID) | ORAL | 1 refills | Status: DC

## 2023-12-15 NOTE — Progress Notes (Signed)
 I have seen and examined this patient with the advanced practice provider STUDENT and agree with the note below

## 2023-12-28 ENCOUNTER — Ambulatory Visit: Admitting: Nurse Practitioner

## 2024-01-19 ENCOUNTER — Emergency Department (HOSPITAL_COMMUNITY)
Admission: EM | Admit: 2024-01-19 | Discharge: 2024-01-19 | Disposition: A | Discharge status: Home / Self Care | Payer: MEDICAID | Attending: Emergency Medicine | Admitting: Emergency Medicine

## 2024-01-19 ENCOUNTER — Encounter (HOSPITAL_COMMUNITY): Payer: Self-pay

## 2024-01-19 ENCOUNTER — Emergency Department (HOSPITAL_COMMUNITY): Payer: MEDICAID

## 2024-01-19 ENCOUNTER — Other Ambulatory Visit: Payer: Self-pay

## 2024-01-19 DIAGNOSIS — R739 Hyperglycemia, unspecified: Secondary | ICD-10-CM | POA: Insufficient documentation

## 2024-01-19 DIAGNOSIS — Z79899 Other long term (current) drug therapy: Secondary | ICD-10-CM | POA: Diagnosis not present

## 2024-01-19 DIAGNOSIS — R0602 Shortness of breath: Secondary | ICD-10-CM | POA: Insufficient documentation

## 2024-01-19 DIAGNOSIS — I10 Essential (primary) hypertension: Secondary | ICD-10-CM | POA: Diagnosis not present

## 2024-01-19 LAB — BASIC METABOLIC PANEL WITH GFR
Anion gap: 11 (ref 5–15)
BUN: 12 mg/dL (ref 6–20)
CO2: 26 mmol/L (ref 22–32)
Calcium: 8.4 mg/dL — ABNORMAL LOW (ref 8.9–10.3)
Chloride: 99 mmol/L (ref 98–111)
Creatinine, Ser: 1.18 mg/dL (ref 0.61–1.24)
GFR, Estimated: >60 mL/min (ref >60)
Glucose, Bld: 106 mg/dL — ABNORMAL HIGH (ref 70–99)
Potassium: 3.9 mmol/L (ref 3.5–5.1)
Sodium: 136 mmol/L (ref 135–145)

## 2024-01-19 LAB — CBC WITH DIFFERENTIAL/PLATELET
Abs Immature Granulocytes: 0.06 10*3/uL (ref 0.00–0.07)
Basophils Absolute: 0 10*3/uL (ref 0.0–0.1)
Basophils Relative: 0 %
Eosinophils Absolute: 0.1 10*3/uL (ref 0.0–0.5)
Eosinophils Relative: 1 %
HCT: 51.2 % (ref 39.0–52.0)
Hemoglobin: 16.8 g/dL (ref 13.0–17.0)
Immature Granulocytes: 1 %
Lymphocytes Relative: 21 %
Lymphs Abs: 1.4 10*3/uL (ref 0.7–4.0)
MCH: 29.7 pg (ref 26.0–34.0)
MCHC: 32.8 g/dL (ref 30.0–36.0)
MCV: 90.5 fL (ref 80.0–100.0)
Monocytes Absolute: 0.5 10*3/uL (ref 0.1–1.0)
Monocytes Relative: 8 %
Neutro Abs: 4.6 10*3/uL (ref 1.7–7.7)
Neutrophils Relative %: 69 %
Platelets: 197 10*3/uL (ref 150–400)
RBC: 5.66 MIL/uL (ref 4.22–5.81)
RDW: 13.7 % (ref 11.5–15.5)
WBC: 6.7 10*3/uL (ref 4.0–10.5)
nRBC: 0 % (ref 0.0–0.2)

## 2024-01-19 LAB — BRAIN NATRIURETIC PEPTIDE: B Natriuretic Peptide: 21.5 pg/mL (ref 0.0–100.0)

## 2024-01-19 LAB — TROPONIN I (HIGH SENSITIVITY): Troponin I (High Sensitivity): 6 ng/L (ref <18)

## 2024-01-19 MED ORDER — IPRATROPIUM-ALBUTEROL 0.5-2.5 (3) MG/3ML IN SOLN
3.0000 mL | Freq: Once | RESPIRATORY_TRACT | Status: AC
  Administered 2024-01-19: 3 mL via RESPIRATORY_TRACT
  Filled 2024-01-19: qty 3

## 2024-01-19 MED ORDER — ALBUTEROL SULFATE HFA 108 (90 BASE) MCG/ACT IN AERS: 1.0000 | INHALATION_SPRAY | Freq: Four times a day (QID) | RESPIRATORY_TRACT | 5 refills | Status: DC | PRN

## 2024-01-19 NOTE — Discharge Instructions (Addendum)
 Evaluation for your shortness of breath was overall reassuring.  Please follow-up your PCP.  I sent albuterol  to your pharmacy.  If you have chest pain, worsening shortness of breath, other please return to the ED for further evaluation.  Suspect your symptoms are related to a viral upper respiratory infection.  Continue assertive hydration at home, Tylenol  and ibuprofen  and rest.

## 2024-01-19 NOTE — ED Triage Notes (Signed)
 Cough and sob for a few days. Pt states sob is intermittent. C/o abdomen feeling tight.

## 2024-01-19 NOTE — ED Provider Notes (Signed)
 [place] EMERGENCY DEPARTMENT AT [facility] Provider Note   CSN: [id number] Arrival date & time: 01/19/24  1433     History  Chief Complaint  Patient presents with   Shortness of Breath   HPI Cristian Anderson is a 48 y.o. male with history of hypertension, hyperlipidemia presenting for shortness of breath.  Started 2 days ago with a productive cough.  Also states his lower chest feels "tight and is made worse with coughing.  Coughing up gray phlegm but no blood.  Also mentioned that his grandkids and his wife had similar symptoms last week.  Denies fever.  Denies lower extremity edema and calf tenderness.  Denies recent long trips or known history of blood clots.  States that he smokes "about a pack and a half of cigarettes" per day.  States right now his shortness of breath has improved.   Shortness of Breath      Home Medications Prior to Admission medications   Medication Sig Start Date End Date Taking? Authorizing Provider  albuterol  (VENTOLIN  HFA) 108 (90 Base) MCG/ACT inhaler Inhale 1-2 puffs into the lungs every 6 (six) hours as needed for wheezing or shortness of breath. 01/19/24  Yes Makenna Macaluso K, PA-C  amLODipine  (NORVASC ) 10 MG tablet Take 1 tablet (10 mg total) by mouth daily. 12/15/23   Fleming, Zelda W, NP  budesonide-formoterol  (SYMBICORT) 160-4.5 MCG/ACT inhaler Inhale 2 puffs into the lungs in the morning and at bedtime. 09/08/23   Newlin, Enobong, MD  carvedilol  (COREG ) 12.5 MG tablet Take 1 tablet (12.5 mg total) by mouth 2 (two) times daily with a meal. 12/15/23   Collins Dean, NP  meloxicam  (MOBIC ) 15 MG tablet Take 1 tablet (15 mg total) by mouth daily. 12/14/23   Fleming, Zelda W, NP  rosuvastatin  (CRESTOR ) 40 MG tablet Take 1 tablet (40 mg total) by mouth daily. 04/08/23   McClung, Angela M, PA-C  valsartan -hydrochlorothiazide  (DIOVAN -HCT) 320-25 MG tablet Take 1 tablet by mouth daily. 12/15/23   Fleming, Zelda W, NP  carvedilol  (COREG ) 6.25 MG  tablet Take 1 tablet (6.25 mg total) by mouth 2 (two) times daily with a meal. 07/31/22   Collins Dean, NP      Allergies    Acetaminophen  and Penicillins    Review of Systems   Review of Systems  Respiratory:  Positive for shortness of breath.     Physical Exam Updated Vital Signs BP (!) 164/104   Pulse 93   Temp 100.1 F (37.8 C) (Oral)   Resp (!) 24   Ht 5\' 8"  (1.727 m)   Wt (!) 163.3 kg   SpO2 95%   BMI 54.74 kg/m  Physical Exam Vitals and nursing note reviewed.  HENT:     Head: Normocephalic and atraumatic.     Mouth/Throat:     Mouth: Mucous membranes are moist.  Eyes:     General:        Right eye: No discharge.        Left eye: No discharge.     Conjunctiva/sclera: Conjunctivae normal.  Cardiovascular:     Rate and Rhythm: Normal rate and regular rhythm.     Pulses: Normal pulses.     Heart sounds: Normal heart sounds.  Pulmonary:     Effort: Pulmonary effort is normal.     Breath sounds: Decreased breath sounds present. No wheezing or rhonchi.  Abdominal:     General: Abdomen is flat.     Palpations: Abdomen is soft.  Skin:    General: Skin is warm and dry.  Neurological:     General: No focal deficit present.  Psychiatric:        Mood and Affect: Mood normal.     ED Results / Procedures / Treatments   Labs (all labs ordered are listed, but only abnormal results are displayed) Labs Reviewed  BASIC METABOLIC PANEL WITH GFR - Abnormal; Notable for the following components:      Result Value   Glucose, Bld 106 (*)    Calcium  8.4 (*)    All other components within normal limits  BRAIN NATRIURETIC PEPTIDE  CBC WITH DIFFERENTIAL/PLATELET  TROPONIN I (HIGH SENSITIVITY)    EKG None  Radiology DG Chest 2 View Result Date: 01/19/2024 CLINICAL DATA:  Shortness of breath and cough. EXAM: CHEST - 2 VIEW COMPARISON:  Radiograph 10/29/2022 FINDINGS: Low lung volumes. Stable cardiomegaly, unchanged mediastinal contours. No focal airspace disease. No  pleural effusion or pneumothorax. No pulmonary edema. Degenerative change in the spine. IMPRESSION: Low lung volumes. Stable cardiomegaly. No acute findings. Electronically Signed   By: Chadwick Colonel M.D.   On: 01/19/2024 17:01    Procedures Procedures    Medications Ordered in ED Medications  ipratropium-albuterol  (DUONEB) 0.5-2.5 (3) MG/3ML nebulizer solution 3 mL (3 mLs Nebulization Given 01/19/24 1605)    ED Course/ Medical Decision Making/ A&P                                 Medical Decision Making Amount and/or Complexity of Data Reviewed Labs: ordered. Radiology: ordered.  Risk Prescription drug management.   Discussed smoking cessation with patient and was they were offerred resources to help stop.  Total time was 5 min CPT code 29562.    Initial Impression and Ddx 32 well-appearing male presenting for shortness of breath.  Exam notable for diminished breath sounds but otherwise unremarkable.  DDx includes COPD exacerbation, CHF exacerbation, pneumonia, pneumothorax, ACS, PE, other. Patient PMH that increases complexity of ED encounter:  hypertension, hyperlipidemia  Interpretation of Diagnostics - I independent reviewed and interpreted the labs as followed: hyperglycemia  - I independently visualized the following imaging with scope of interpretation limited to determining acute life threatening conditions related to emergency care: CXR, which revealed Low lung volumes. Stable cardiomegaly. No acute findings.   - I personally reviewed and interpreted EKG which revealed sinus rhythm  Patient Reassessment and Ultimate Disposition/Management On reassessment, patient was asymptomatic.  Workup was reassuring.  Suspect viral illness.  Discussed smoking cessation.  Advised supportive treatment at home.  Blood pressure was slightly elevated.  Advised him to continue taking his BP meds as prescribed.  Discussed return precautions.  Discharged in good condition.  Considered  PE but unlikely given chest pain is nonpleuritic and nonexertional and PERC negative.  Also considered ACS but chest pain seems to be more related to his coughing, negative troponin and EKG reassuring.  Advised to follow-up with his PCP.  Discharged.  Patient management required discussion with the following services or consulting groups:  None  Complexity of Problems Addressed Acute complicated illness or Injury  Additional Data Reviewed and Analyzed Further history obtained from: Past medical history and medications listed in the EMR and Prior ED visit notes  Patient Encounter Risk Assessment None         Final Clinical Impression(s) / ED Diagnoses Final diagnoses:  SOB (shortness of breath)    Rx / DC  Orders ED Discharge Orders          Ordered    albuterol  (VENTOLIN  HFA) 108 (90 Base) MCG/ACT inhaler  Every 6 hours PRN        01/19/24 1742              Kobie Matkins K, PA-C 01/19/24 1743    Tegeler, Marine Sia, MD 01/19/24 [phone]

## 2024-02-15 ENCOUNTER — Other Ambulatory Visit: Payer: Self-pay | Admitting: Nurse Practitioner

## 2024-02-15 DIAGNOSIS — G8929 Other chronic pain: Secondary | ICD-10-CM

## 2024-02-16 DIAGNOSIS — R0602 Shortness of breath: Secondary | ICD-10-CM | POA: Insufficient documentation

## 2024-02-16 DIAGNOSIS — E876 Hypokalemia: Secondary | ICD-10-CM | POA: Diagnosis not present

## 2024-02-16 DIAGNOSIS — R739 Hyperglycemia, unspecified: Secondary | ICD-10-CM | POA: Insufficient documentation

## 2024-02-16 DIAGNOSIS — Z79899 Other long term (current) drug therapy: Secondary | ICD-10-CM | POA: Diagnosis not present

## 2024-02-16 DIAGNOSIS — I10 Essential (primary) hypertension: Secondary | ICD-10-CM | POA: Insufficient documentation

## 2024-02-17 ENCOUNTER — Other Ambulatory Visit: Payer: Self-pay

## 2024-02-17 ENCOUNTER — Emergency Department (HOSPITAL_COMMUNITY): Payer: MEDICAID

## 2024-02-17 ENCOUNTER — Emergency Department (HOSPITAL_COMMUNITY)
Admission: EM | Admit: 2024-02-17 | Discharge: 2024-02-17 | Disposition: A | Discharge status: Home / Self Care | Payer: MEDICAID | Attending: Emergency Medicine | Admitting: Emergency Medicine

## 2024-02-17 DIAGNOSIS — R0602 Shortness of breath: Secondary | ICD-10-CM

## 2024-02-17 LAB — CBC
HCT: 49.2 % (ref 39.0–52.0)
Hemoglobin: 15.7 g/dL (ref 13.0–17.0)
MCH: 29.6 pg (ref 26.0–34.0)
MCHC: 31.9 g/dL (ref 30.0–36.0)
MCV: 92.7 fL (ref 80.0–100.0)
Platelets: 208 10*3/uL (ref 150–400)
RBC: 5.31 MIL/uL (ref 4.22–5.81)
RDW: 13.8 % (ref 11.5–15.5)
WBC: 8.5 10*3/uL (ref 4.0–10.5)
nRBC: 0 % (ref 0.0–0.2)

## 2024-02-17 LAB — BASIC METABOLIC PANEL WITH GFR
Anion gap: 12 (ref 5–15)
BUN: 11 mg/dL (ref 6–20)
CO2: 26 mmol/L (ref 22–32)
Calcium: 8.8 mg/dL — ABNORMAL LOW (ref 8.9–10.3)
Chloride: 102 mmol/L (ref 98–111)
Creatinine, Ser: 1.21 mg/dL (ref 0.61–1.24)
GFR, Estimated: >60 mL/min (ref >60)
Glucose, Bld: 246 mg/dL — ABNORMAL HIGH (ref 70–99)
Potassium: 3.3 mmol/L — ABNORMAL LOW (ref 3.5–5.1)
Sodium: 140 mmol/L (ref 135–145)

## 2024-02-17 LAB — TROPONIN I (HIGH SENSITIVITY)
Troponin I (High Sensitivity): 8 ng/L (ref <18)
Troponin I (High Sensitivity): 8 ng/L (ref <18)

## 2024-02-17 LAB — BRAIN NATRIURETIC PEPTIDE: B Natriuretic Peptide: 14.5 pg/mL (ref 0.0–100.0)

## 2024-02-17 MED ORDER — AZITHROMYCIN 250 MG PO TABS: 250.0000 mg | ORAL_TABLET | Freq: Every day | ORAL | 0 refills | Status: DC

## 2024-02-17 MED ORDER — POTASSIUM CHLORIDE CRYS ER 20 MEQ PO TBCR
40.0000 meq | EXTENDED_RELEASE_TABLET | Freq: Once | ORAL | Status: AC
  Administered 2024-02-17: 40 meq via ORAL
  Filled 2024-02-17: qty 2

## 2024-02-17 NOTE — Discharge Instructions (Signed)
 As we discussed your workup today was overall reassuring, there was a questionable opacity on your chest x-ray, given your otherwise clear workup and improvement after your inhaler I have overall low clinical suspicion that this is representative of an infection especially because you had a normal white blood cell count, however if you do have some increasing shortness of breath, cough, develop fever or chills over the next few days it would be reasonable to take the course of antibiotics that I have sent to your pharmacy.  Continue to use your home inhaler and CPAP machine.  Follow-up with your primary care doctor to discuss your blood pressure medication if it remains elevated.  Your potassium was slightly low in the ED and we gave you 1 dose today.  You may want to recheck this lab value in the next few weeks to ensure that it has returned to a normal value.  It was a pleasure taking care of you today.

## 2024-02-17 NOTE — ED Provider Notes (Signed)
 [facility] AT [facility] Provider Note   CSN: [id number] Arrival date & time: 02/16/24  2358     Patient presents with: Shortness of Breath   Cristian Anderson is a 48 y.o. male with a past medical history significant for tobacco abuse, hypertension, GERD, obesity, obstructive sleep apnea who presents with concern for shortness of breath, chest heaviness that started around 45 minutes prior to arrival which was at around 11 PM last night.  He reports he was laying down and started to feel shortness of breath.  Got up and used his inhaler.  He reports initially did not help too much but since he has been waiting for evaluation he reports the symptoms have improved.  He reports feeling some chest tightness which has since improved.  No chest pain at rest.  No nausea, vomiting.  No fever, chills or recent coughing.    Shortness of Breath      Prior to Admission medications   Medication Sig Start Date End Date Taking? Authorizing Provider  azithromycin  (ZITHROMAX ) 250 MG tablet Take 1 tablet (250 mg total) by mouth daily. Take first 2 tablets together, then 1 every day until finished. 02/17/24  Yes Nonie Lochner H, PA-C  albuterol  (VENTOLIN  HFA) 108 (90 Base) MCG/ACT inhaler Inhale 1-2 puffs into the lungs every 6 (six) hours as needed for wheezing or shortness of breath. 01/19/24   Lang Norleen POUR, PA-C  amLODipine  (NORVASC ) 10 MG tablet Take 1 tablet (10 mg total) by mouth daily. 12/15/23   Fleming, Zelda W, NP  budesonide-formoterol  [facility]) 160-4.5 MCG/ACT inhaler Inhale 2 puffs into the lungs in the morning and at bedtime. 09/08/23   Newlin, Enobong, MD  carvedilol  (COREG ) 12.5 MG tablet Take 1 tablet (12.5 mg total) by mouth 2 (two) times daily with a meal. 12/15/23   Theotis Haze ORN, NP  meloxicam  (MOBIC ) 15 MG tablet TAKE 1 TABLET (15 MG TOTAL) BY MOUTH DAILY. 02/15/24   Fleming, Zelda W, NP  rosuvastatin  (CRESTOR ) 40 MG tablet Take 1 tablet (40 mg total)  by mouth daily. 04/08/23   Danton Jon HERO, PA-C  valsartan -hydrochlorothiazide  (DIOVAN -HCT) 320-25 MG tablet Take 1 tablet by mouth daily. 12/15/23   Fleming, Zelda W, NP  carvedilol  (COREG ) 6.25 MG tablet Take 1 tablet (6.25 mg total) by mouth 2 (two) times daily with a meal. 07/31/22   Theotis Haze ORN, NP    Allergies: Acetaminophen  and Penicillins    Review of Systems  Respiratory:  Positive for shortness of breath.   All other systems reviewed and are negative.   Updated Vital Signs BP (!) 173/82   Pulse 71   Temp 98.2 F (36.8 C) (Oral)   Resp 18   Ht 5' 8 (1.727 m)   Wt (!) 154.2 kg   SpO2 93%   BMI 51.70 kg/m   Physical Exam Vitals and nursing note reviewed.  Constitutional:      General: He is not in acute distress.    Appearance: Normal appearance. He is obese.  HENT:     Head: Normocephalic and atraumatic.   Eyes:     General:        Right eye: No discharge.        Left eye: No discharge.    Cardiovascular:     Rate and Rhythm: Normal rate and regular rhythm.     Heart sounds: No murmur heard.    No friction rub. No gallop.  Pulmonary:     Effort:  Pulmonary effort is normal.     Breath sounds: Normal breath sounds.     Comments: No wheezing, rhonchi, stridor, rales Abdominal:     General: Bowel sounds are normal.     Palpations: Abdomen is soft.   Skin:    General: Skin is warm and dry.     Capillary Refill: Capillary refill takes less than 2 seconds.   Neurological:     Mental Status: He is alert and oriented to person, place, and time.   Psychiatric:        Mood and Affect: Mood normal.        Behavior: Behavior normal.     (all labs ordered are listed, but only abnormal results are displayed) Labs Reviewed  BASIC METABOLIC PANEL WITH GFR - Abnormal; Notable for the following components:      Result Value   Potassium 3.3 (*)    Glucose, Bld 246 (*)    Calcium  8.8 (*)    All other components within normal limits  CBC  BRAIN  NATRIURETIC PEPTIDE  TROPONIN I (HIGH SENSITIVITY)  TROPONIN I (HIGH SENSITIVITY)    EKG: EKG Interpretation Date/Time:  Thursday February 17 2024 00:07:00 EDT Ventricular Rate:  93 PR Interval:  160 QRS Duration:  86 QT Interval:  356 QTC Calculation: 443 R Axis:   64  Text Interpretation: Sinus rhythm Nonspecific T abnormalities, diffuse leads Baseline wander in lead(s) V2 Confirmed by Griselda Norris [id number] on 02/17/2024 5:35:58 AM  Radiology: DG Chest 2 View Result Date: 02/17/2024 CLINICAL DATA:  cp.  Short of breath EXAM: CHEST - 2 VIEW COMPARISON:  Chest x-ray 01/19/2024, CT chest 01/13/2011, CT abdomen pelvis 09/14/2016 FINDINGS: The heart and mediastinal contours are within normal limits. Question developing retrocardiac airspace opacity. No pulmonary edema. Blunting of the costophrenic angles with likely trace to small volume left greater than right pleural effusions. No pneumothorax. No acute osseous abnormality. IMPRESSION: Question developing retrocardiac airspace opacity. Blunting of the costophrenic angles with likely trace to small volume left greater than right pleural effusions. Electronically Signed   By: Morgane  Naveau M.D.   On: 02/17/2024 00:47     Procedures   Medications Ordered in the ED  potassium chloride  SA (KLOR-CON  M) CR tablet 40 mEq (has no administration in time range)                                    Medical Decision Making  This patient is a 48 y.o. male  who presents to the ED for concern of shortness of breath.   Differential diagnoses prior to evaluation: The emergent differential diagnosis includes, but is not limited to,  asthma exacerbation, COPD exacerbation, acute upper respiratory infection, acute bronchitis, chronic bronchitis, interstitial lung disease, ARDS, PE, pneumonia, atypical ACS, carbon monoxide poisoning, spontaneous pneumothorax, new CHF vs CHF exacerbation, versus other . This is not an exhaustive differential.   Past  Medical History / Co-morbidities / Social History: tobacco abuse, hypertension, GERD, obesity, obstructive sleep apnea  Additional history: Chart reviewed. Pertinent results include: Reviewed lab work, imaging from previous emergency department visits, hospital admission for hypoxic respiratory failure late last year.  Reviewed echo from November of last year which showed normal left ventricular ejection fraction.  Physical Exam: Physical exam performed. The pertinent findings include: Vital signs stable, normal oxygen saturation on room air, he initially had some tachypnea on ED arrival at midnight last night.  He has been hypertensive, blood pressure 173/97 on arrival, improved to 173/82 on repeat evaluation.  He has no wheezing, rhonchi, stridor, rales.  No focal consolidation noted.  Lab Tests/Imaging studies: I personally interpreted labs/imaging and the pertinent results include: CBC unremarkable, notably with no leukocytosis, BMP with mild hypokalemia potassium 3.3, hyperglycemia, glucose 246.  His BNP is within normal range at 14.5, negative troponin x 2.  I independently interpreted plain film chest x-ray which shows no focal consolidation, question retrocardiac airspace opacity, possible small developing pleural effusions noted --discussed the patient clinically no signs or symptoms of pneumonia but I think reasonable to discharge with a course of azithromycin  to use as needed, otherwise encourage close PCP follow-up for potassium and respiratory recheck. I agree with the radiologist interpretation.  Cardiac monitoring: EKG obtained and interpreted by myself and attending physician which shows: NSR, nonspecific T wave abnormalities in inferior leads   Medications: I ordered medication including potassium for mild hypokalemia.  I have reviewed the patients home medicines and have made adjustments as needed.   Disposition: After consideration of the diagnostic results and the patients  response to treatment, I feel that patient stable for discharge with plan as above, he is feeling improved and requesting discharge at time of reevaluation.SABRA   emergency department workup does not suggest an emergent condition requiring admission or immediate intervention beyond what has been performed at this time. The plan is: as above. The patient is safe for discharge and has been instructed to return immediately for worsening symptoms, change in symptoms or any other concerns.   Final diagnoses:  SOB (shortness of breath)    ED Discharge Orders          Ordered    azithromycin  (ZITHROMAX ) 250 MG tablet  Daily        02/17/24 0654               Rosan Sherlean DEL, PA-C 02/17/24 0700    Griselda Norris, MD 02/17/24 [phone]

## 2024-02-17 NOTE — ED Triage Notes (Signed)
 Pt reports shortness of breath and heaviness in his chest that started about 45 minutes ago. He was laying down and he started to feel SOB, he got up and used his inhaler, which helped somewhat.

## 2024-03-13 ENCOUNTER — Telehealth: Payer: Self-pay | Admitting: Nurse Practitioner

## 2024-03-13 NOTE — Telephone Encounter (Signed)
 Contacted pt left vm to confirmed appt

## 2024-03-14 ENCOUNTER — Ambulatory Visit: Payer: MEDICAID | Admitting: Nurse Practitioner

## 2024-03-21 ENCOUNTER — Other Ambulatory Visit: Payer: Self-pay | Admitting: Nurse Practitioner

## 2024-03-21 DIAGNOSIS — G8929 Other chronic pain: Secondary | ICD-10-CM

## 2024-09-06 ENCOUNTER — Telehealth: Payer: Self-pay | Admitting: Nurse Practitioner

## 2024-09-06 NOTE — Telephone Encounter (Signed)
 Contacted pt to confirmed appt (per vr)

## 2024-09-08 ENCOUNTER — Ambulatory Visit: Payer: MEDICAID | Attending: Nurse Practitioner | Admitting: Nurse Practitioner

## 2024-09-08 ENCOUNTER — Encounter: Payer: Self-pay | Admitting: Nurse Practitioner

## 2024-09-08 VITALS — BP 133/79 | HR 70 | Ht 68.0 in | Wt 379.6 lb

## 2024-09-08 DIAGNOSIS — R0601 Orthopnea: Secondary | ICD-10-CM

## 2024-09-08 DIAGNOSIS — J209 Acute bronchitis, unspecified: Secondary | ICD-10-CM

## 2024-09-08 DIAGNOSIS — I10 Essential (primary) hypertension: Secondary | ICD-10-CM

## 2024-09-08 DIAGNOSIS — Z1211 Encounter for screening for malignant neoplasm of colon: Secondary | ICD-10-CM

## 2024-09-08 DIAGNOSIS — E785 Hyperlipidemia, unspecified: Secondary | ICD-10-CM

## 2024-09-08 MED ORDER — ROSUVASTATIN CALCIUM 40 MG PO TABS: 40.0000 mg | ORAL_TABLET | Freq: Every day | ORAL | 3 refills | Status: AC

## 2024-09-08 MED ORDER — BUDESONIDE-FORMOTEROL FUMARATE 160-4.5 MCG/ACT IN AERO: 2.0000 | INHALATION_SPRAY | Freq: Two times a day (BID) | RESPIRATORY_TRACT | 3 refills | Status: AC

## 2024-09-08 MED ORDER — CARVEDILOL 12.5 MG PO TABS: 12.5000 mg | ORAL_TABLET | Freq: Two times a day (BID) | ORAL | 1 refills | Status: AC

## 2024-09-08 MED ORDER — AMLODIPINE BESYLATE 10 MG PO TABS: 10.0000 mg | ORAL_TABLET | Freq: Every day | ORAL | 1 refills | Status: AC

## 2024-09-08 MED ORDER — PREDNISONE 20 MG PO TABS: 40.0000 mg | ORAL_TABLET | Freq: Every day | ORAL | 0 refills | Status: AC

## 2024-09-08 MED ORDER — VALSARTAN-HYDROCHLOROTHIAZIDE 320-25 MG PO TABS: 1.0000 | ORAL_TABLET | Freq: Every day | ORAL | 1 refills | Status: AC

## 2024-09-08 MED ORDER — ALBUTEROL SULFATE HFA 108 (90 BASE) MCG/ACT IN AERS: 1.0000 | INHALATION_SPRAY | Freq: Four times a day (QID) | RESPIRATORY_TRACT | 5 refills | Status: AC | PRN

## 2024-09-08 MED ORDER — ALBUTEROL SULFATE (2.5 MG/3ML) 0.083% IN NEBU: 2.5000 mg | INHALATION_SOLUTION | Freq: Four times a day (QID) | RESPIRATORY_TRACT | 1 refills | Status: AC | PRN

## 2024-09-08 NOTE — Patient Instructions (Signed)
 [facility] Located in: [place] [facility] [address] Address: [address], [place], [place] [address] Phone: [phone]

## 2024-09-08 NOTE — Progress Notes (Unsigned)
 "  Assessment & Plan:  Cristian Anderson was seen today for hypertension.  Diagnoses and all orders for this visit:  Primary hypertension -     amLODipine  (NORVASC ) 10 MG tablet; Take 1 tablet (10 mg total) by mouth daily. -     carvedilol  (COREG ) 12.5 MG tablet; Take 1 tablet (12.5 mg total) by mouth 2 (two) times daily with a meal. -     valsartan -hydrochlorothiazide  (DIOVAN -HCT) 320-25 MG tablet; Take 1 tablet by mouth daily. -     CMP14+EGFR  Dyslipidemia, goal LDL below 70 -     rosuvastatin  (CRESTOR ) 40 MG tablet; Take 1 tablet (40 mg total) by mouth daily.  COPD with acute bronchitis (HCC) -     budesonide -formoterol  (SYMBICORT ) 160-4.5 MCG/ACT inhaler; Inhale 2 puffs into the lungs in the morning and at bedtime. -     For home use only DME Nebulizer machine -     albuterol  (PROVENTIL ) (2.5 MG/3ML) 0.083% nebulizer solution; Take 3 mLs (2.5 mg total) by nebulization every 6 (six) hours as needed for wheezing or shortness of breath. -     albuterol  (VENTOLIN  HFA) 108 (90 Base) MCG/ACT inhaler; Inhale 1-2 puffs into the lungs every 6 (six) hours as needed for wheezing or shortness of breath. -     predniSONE  (DELTASONE ) 20 MG tablet; Take 2 tablets (40 mg total) by mouth daily with breakfast for 5 days.  Orthopnea -     Brain natriuretic peptide    Patient has been counseled on age-appropriate routine health concerns for screening and prevention. These are reviewed and up-to-date. Referrals have been placed accordingly. Immunizations are up-to-date or declined.    Subjective:   Chief Complaint  Patient presents with   Hypertension    Cristian Anderson 49 y.o. male presents to office today    HPI  ROS  Past Medical History:  Diagnosis Date   Allergy    Anxiety attack    Bipolar 1 disorder (HCC)    Depression    Fatty liver    GERD (gastroesophageal reflux disease)    Hypertension    Obesity    Oxygen deficiency    C-pap machine   Panic attack    PTSD (post-traumatic  stress disorder)    Schizophrenia (HCC)    Sleep apnea     No past surgical history on file.  Family History  Problem Relation Age of Onset   Diabetes Father    Colon polyps Brother    Anxiety disorder Brother    Depression Brother    Heart disease Maternal Aunt     Social History Reviewed with no changes to be made today.   Outpatient Medications Prior to Visit  Medication Sig Dispense Refill   albuterol  (VENTOLIN  HFA) 108 (90 Base) MCG/ACT inhaler Inhale 1-2 puffs into the lungs every 6 (six) hours as needed for wheezing or shortness of breath. 1 each 5   amLODipine  (NORVASC ) 10 MG tablet Take 1 tablet (10 mg total) by mouth daily. 90 tablet 1   budesonide -formoterol  (SYMBICORT ) 160-4.5 MCG/ACT inhaler Inhale 2 puffs into the lungs in the morning and at bedtime. 1 each 3   carvedilol  (COREG ) 12.5 MG tablet Take 1 tablet (12.5 mg total) by mouth 2 (two) times daily with a meal. 180 tablet 1   rosuvastatin  (CRESTOR ) 40 MG tablet Take 1 tablet (40 mg total) by mouth daily. 90 tablet 3   valsartan -hydrochlorothiazide  (DIOVAN -HCT) 320-25 MG tablet Take 1 tablet by mouth daily. 90 tablet 1  azithromycin  (ZITHROMAX ) 250 MG tablet Take 1 tablet (250 mg total) by mouth daily. Take first 2 tablets together, then 1 every day until finished. 6 tablet 0   meloxicam  (MOBIC ) 15 MG tablet TAKE 1 TABLET (15 MG TOTAL) BY MOUTH DAILY. 30 tablet 0   No facility-administered medications prior to visit.    Allergies[1]     Objective:    BP 133/79 (BP Location: Left Arm, Patient Position: Sitting, Cuff Size: Normal)   Pulse 70   Ht 5' 8 (1.727 m)   Wt (!) 379 lb 9.6 oz (172.2 kg)   SpO2 98%   BMI 57.72 kg/m  Wt Readings from Last 3 Encounters:  09/08/24 (!) 379 lb 9.6 oz (172.2 kg)  02/17/24 (!) 340 lb (154.2 kg)  01/19/24 (!) 360 lb (163.3 kg)    Physical Exam       Patient has been counseled extensively about nutrition and exercise as well as the importance of adherence with  medications and regular follow-up. The patient was given clear instructions to go to ER or return to medical center if symptoms don't improve, worsen or new problems develop. The patient verbalized understanding.   Follow-up: Return in about 3 months (around 12/07/2024).   Cristian LELON Servant, Cristian Anderson [facility] [place], [place] [phone]   09/08/2024, 10:57 AM    [1]  Allergies Allergen Reactions   Acetaminophen  Itching and Rash   Penicillins Itching and Rash   "

## 2024-09-09 ENCOUNTER — Ambulatory Visit: Payer: Self-pay | Admitting: Nurse Practitioner

## 2024-09-09 DIAGNOSIS — M7989 Other specified soft tissue disorders: Secondary | ICD-10-CM

## 2024-09-09 LAB — BRAIN NATRIURETIC PEPTIDE: BNP: 9.9 pg/mL (ref 0.0–100.0)

## 2024-09-09 LAB — CMP14+EGFR
ALT: 32 IU/L (ref 0–44)
AST: 30 IU/L (ref 0–40)
Albumin: 4.1 g/dL (ref 4.1–5.1)
Alkaline Phosphatase: 69 IU/L (ref 47–123)
BUN/Creatinine Ratio: 8 — ABNORMAL LOW (ref 9–20)
BUN: 9 mg/dL (ref 6–24)
Bilirubin Total: 0.4 mg/dL (ref 0.0–1.2)
CO2: 27 mmol/L (ref 20–29)
Calcium: 9.1 mg/dL (ref 8.7–10.2)
Chloride: 97 mmol/L (ref 96–106)
Creatinine, Ser: 1.11 mg/dL (ref 0.76–1.27)
Globulin, Total: 2.8 g/dL (ref 1.5–4.5)
Glucose: 103 mg/dL — ABNORMAL HIGH (ref 70–99)
Potassium: 4.2 mmol/L (ref 3.5–5.2)
Sodium: 140 mmol/L (ref 134–144)
Total Protein: 6.9 g/dL (ref 6.0–8.5)
eGFR: 81 mL/min/1.73

## 2024-09-09 MED ORDER — FUROSEMIDE 20 MG PO TABS: 20.0000 mg | ORAL_TABLET | Freq: Every day | ORAL | 0 refills | Status: AC

## 2024-12-08 ENCOUNTER — Ambulatory Visit: Payer: Self-pay | Admitting: Nurse Practitioner
